# Patient Record
Sex: Female | Born: 1970 | Race: Black or African American | Hispanic: No | Marital: Married | Smoking: Current some day smoker
Health system: Southern US, Community
[De-identification: ages and names within clinical notes are randomized; demographics above are authoritative.]

## PROBLEM LIST (undated history)

## (undated) DIAGNOSIS — D259 Leiomyoma of uterus, unspecified: Secondary | ICD-10-CM

## (undated) DIAGNOSIS — Z720 Tobacco use: Secondary | ICD-10-CM

## (undated) DIAGNOSIS — T7840XA Allergy, unspecified, initial encounter: Secondary | ICD-10-CM

## (undated) HISTORY — DX: Tobacco use: Z72.0

## (undated) HISTORY — DX: Leiomyoma of uterus, unspecified: D25.9

## (undated) HISTORY — DX: Allergy, unspecified, initial encounter: T78.40XA

---

## 1997-10-20 ENCOUNTER — Other Ambulatory Visit: Admission: RE | Admit: 1997-10-20 | Discharge: 1997-10-20 | Payer: Self-pay | Admitting: Obstetrics

## 1998-10-27 ENCOUNTER — Other Ambulatory Visit: Admission: RE | Admit: 1998-10-27 | Discharge: 1998-10-27 | Payer: Self-pay | Admitting: Obstetrics

## 1998-10-31 ENCOUNTER — Encounter: Payer: Self-pay | Admitting: Obstetrics

## 1998-10-31 ENCOUNTER — Ambulatory Visit (HOSPITAL_COMMUNITY): Admission: RE | Admit: 1998-10-31 | Discharge: 1998-10-31 | Payer: Self-pay | Admitting: Obstetrics

## 1999-09-19 ENCOUNTER — Emergency Department (HOSPITAL_COMMUNITY): Admission: EM | Admit: 1999-09-19 | Discharge: 1999-09-19 | Payer: Self-pay | Admitting: Emergency Medicine

## 2000-09-03 ENCOUNTER — Emergency Department (HOSPITAL_COMMUNITY): Admission: EM | Admit: 2000-09-03 | Discharge: 2000-09-03 | Payer: Self-pay | Admitting: Emergency Medicine

## 2002-01-23 ENCOUNTER — Emergency Department (HOSPITAL_COMMUNITY): Admission: EM | Admit: 2002-01-23 | Discharge: 2002-01-23 | Payer: Self-pay | Admitting: Emergency Medicine

## 2002-01-26 ENCOUNTER — Emergency Department (HOSPITAL_COMMUNITY): Admission: EM | Admit: 2002-01-26 | Discharge: 2002-01-26 | Payer: Self-pay | Admitting: Emergency Medicine

## 2002-01-28 ENCOUNTER — Inpatient Hospital Stay (HOSPITAL_COMMUNITY): Admission: AD | Admit: 2002-01-28 | Discharge: 2002-01-28 | Payer: Self-pay | Admitting: Obstetrics and Gynecology

## 2002-12-24 ENCOUNTER — Encounter: Admission: RE | Admit: 2002-12-24 | Discharge: 2002-12-24 | Payer: Self-pay | Admitting: Obstetrics and Gynecology

## 2003-01-14 ENCOUNTER — Encounter: Admission: RE | Admit: 2003-01-14 | Discharge: 2003-01-14 | Payer: Self-pay | Admitting: Obstetrics and Gynecology

## 2003-08-12 ENCOUNTER — Inpatient Hospital Stay (HOSPITAL_COMMUNITY): Admission: AD | Admit: 2003-08-12 | Discharge: 2003-08-13 | Payer: Self-pay | Admitting: Family Medicine

## 2003-09-02 ENCOUNTER — Encounter: Admission: RE | Admit: 2003-09-02 | Discharge: 2003-09-02 | Payer: Self-pay | Admitting: Family Medicine

## 2004-03-02 ENCOUNTER — Ambulatory Visit: Payer: Self-pay | Admitting: Family Medicine

## 2004-03-08 ENCOUNTER — Ambulatory Visit (HOSPITAL_COMMUNITY): Admission: RE | Admit: 2004-03-08 | Discharge: 2004-03-08 | Payer: Self-pay | Admitting: Family Medicine

## 2004-03-23 ENCOUNTER — Ambulatory Visit: Payer: Self-pay | Admitting: Family Medicine

## 2006-07-21 ENCOUNTER — Emergency Department (HOSPITAL_COMMUNITY): Admission: EM | Admit: 2006-07-21 | Discharge: 2006-07-21 | Payer: Self-pay | Admitting: *Deleted

## 2009-10-05 ENCOUNTER — Emergency Department (HOSPITAL_COMMUNITY): Admission: EM | Admit: 2009-10-05 | Discharge: 2009-10-05 | Payer: Self-pay | Admitting: Family Medicine

## 2009-12-05 ENCOUNTER — Emergency Department (HOSPITAL_COMMUNITY): Admission: EM | Admit: 2009-12-05 | Discharge: 2009-12-05 | Payer: Self-pay | Admitting: Emergency Medicine

## 2010-08-26 ENCOUNTER — Inpatient Hospital Stay (INDEPENDENT_AMBULATORY_CARE_PROVIDER_SITE_OTHER)
Admission: RE | Admit: 2010-08-26 | Discharge: 2010-08-26 | Disposition: A | Discharge status: Home / Self Care | Payer: Self-pay | Source: Ambulatory Visit | Attending: Family Medicine | Admitting: Family Medicine

## 2010-08-26 ENCOUNTER — Ambulatory Visit (INDEPENDENT_AMBULATORY_CARE_PROVIDER_SITE_OTHER): Payer: Self-pay

## 2010-08-26 DIAGNOSIS — M129 Arthropathy, unspecified: Secondary | ICD-10-CM

## 2010-08-26 DIAGNOSIS — M79609 Pain in unspecified limb: Secondary | ICD-10-CM

## 2010-08-26 DIAGNOSIS — M25469 Effusion, unspecified knee: Secondary | ICD-10-CM

## 2010-09-08 NOTE — Group Therapy Note (Signed)
[name], [name] NO.:  [id number]   MEDICAL RECORD NO.:  [id number]                   PATIENT TYPE:  OUT   LOCATION:  [facility]                           FACILITY:  [facility]   PHYSICIAN:  Rosemarie Ax, MD                DATE OF BIRTH:  05/12/1970   DATE OF SERVICE:  12/24/2002                                    CLINIC NOTE   CHIEF COMPLAINT:  Complete physical examination, vaginal discharge,  dyspareunia.   OBJECTIVE:  The patient is a 40 year old African-American female who  presents as a new patient to the clinic today for her annual Pap smear.  She  denies any history of abnormal Paps in the past.  Last Pap smear 2003.  LMP  December 14, 2002.   She reports several months of vaginal discharge about a week after each  menses with increased odor.  No itching or burning in her vaginal area.  No  vaginal lesions.   Also reports pain during intercourse only with deep penetration.  She feels  like her husband is hitting something when they are having sex.  She feels  like she has always felt this way during sex.  She does also report some  vaginal dryness, but states this is not a problem since they use  lubrication.  She has been told in the past that her uterus is retroverted.   She also notes that she has difficulty conceiving.  She has never been  pregnant and has been having intercourse for many years without  contraception and has never gotten pregnant.  She does think she has had one  or two episodes of pelvic inflammatory disease in the past secondary to  Chlamydia.   PAST MEDICAL HISTORY:  History of jaundice as an infant.   SOCIAL HISTORY:  She is married and states she thinks she is a monogamous  relationship.  She smokes four cigarettes a day x15 years.  Drinks about two  alcoholic beverages a week.  She works.   REVIEW OF SYSTEMS:  As above, otherwise negative.   FAMILY HISTORY:  Unremarkable.   PAST  OBSTETRICAL/GYNECOLOGICAL HISTORY:  No pregnancies.  Menstrual cycles  are regular, 21-24 days apart.  They last five to seven days with medium  flow and mild cramps.  40 years old at menarche.  She has been on the birth  control pill in the past, but has not taken this for about three months.   PHYSICAL EXAMINATION:  VITAL SIGNS:  Stated.  GENERAL:  Well-developed, well-nourished.  HEENT:  Normocephalic, atraumatic.  NECK:  No thyromegaly.  CARDIOVASCULAR:  Regular rate and rhythm.  No murmurs.  LUNGS:  Clear to auscultation bilaterally.  Good air movement.  ABDOMEN:  Soft, nontender, nondistended.  No hepatosplenomegaly.  BREASTS:  No skin changes.  No nipple discharge.  No masses or  lymphadenopathy bilaterally.  EXTREMITIES:  No clubbing, cyanosis, edema.  PELVIC:  Normal external female genitalia.  Slight  swelling of the left  labia majora consistent with prior Bartholin's cyst several months ago but  this appears well healed with no residual significant cyst.  Normal  appearing vaginal mucosa and cervix.  Frothy white discharge in the vault.  Pap smear obtained.  Bimanual examination shows cervix displaced to the  right, uterus retroverted.  No adnexal tenderness or masses.  No cervical  motion tenderness.   LABORATORIES:  Wet prep positive for Trichomonas and clue cells.  GC,  Chlamydia pending.   ASSESSMENT/PLAN:  1. Pap smear obtained.  2. Trichomonas, bacterial vaginosis.  The patient was counseled about this.     Will treat with Flagyl 250 mg p.o. t.i.d. x7 days.  Advised partner to be     treated as well.  Advised no alcohol while using this.  3. Dyspareunia.  Follow up in two to three weeks after she tries positional     changes such as missionary position with her legs flat.  At that time if     still having pain, will do ultrasound.  4. Difficulty conceiving.  May be secondary to previous episodes of pelvic     inflammatory disease.  We agree that if she ever decides  that she is very     serious about getting pregnant that she will make an appointment to come     back and discuss this.                                               Rosemarie Ax, MD    NR/MEDQ  D:  12/24/2002  T:  12/25/2002  Job:  [id number]

## 2010-09-08 NOTE — Group Therapy Note (Signed)
[name], [name] NO.:  [id number]   MEDICAL RECORD NO.:  [id number]          PATIENT TYPE:  WOC   LOCATION:  [facility]                   FACILITY:  [facility]   PHYSICIAN:  Tinnie Gens, MD        DATE OF BIRTH:  [birth year]   DATE OF SERVICE:  03/23/2004                                    CLINIC NOTE   CHIEF COMPLAINT:  Follow-up.   HISTORY OF PRESENT ILLNESS:  The patient is a 40 year old G0 who has  previously been seen in this clinic for fibroids.  She had a recent  ultrasound that showed a 2 mm endometrial stripe with multiple fibroids, the  largest of which was 5.4 cm on the uterus fundus.  Most of the fibroids  appeared to be subserosal.  The patient continues to have some issues with  bleeding.  She is off the birth control now that she is 47 and continues to  be a smoker.  The patient has had a Pap smear in the last 3 weeks and it was  normal.  The patient is still considering her fertility options.  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   PHYSICAL EXAMINATION TODAY:  VITAL SIGNS:  Are as noted in the chart, blood  pressure is 130/90.  GENERAL:  She is a well-developed, well-nourished black female in no acute  distress.  ABDOMEN:  Soft, nontender, nondistended.   IMPRESSION:  1.  Fibroid uterus.  2.  Previous infertility.   PLAN:  Because of the patient's fertility issues have discussed timed  intercourse and give her couple of months of this.  If she fails to have  spontaneous conception will consider workup repeat plus or minus referral to  endocrinology.      TP/MEDQ  D:  03/23/2004  T:  03/23/2004  Job:  [id number]

## 2010-09-08 NOTE — Group Therapy Note (Signed)
NAME:  [name], INGBER NO.:  [id number]   MEDICAL RECORD NO.:  [id number]                   PATIENT TYPE:  OUT   LOCATION[facility]                           [facility]ACILITY[facility]   PHYSICIAN:  Argentina Donovan, MD                     DATE OF BIRTH:  August 14, 1970   DATE OF SERVICE:  09/02/2003                                    CLINIC NOTE   REASON FOR VISIT:  The patient is a 40 year old nulligravida who was seen on  August 12, 2003 in the [facility] because of dysfunctional uterine bleeding.  Examination was normal and the patient was placed on Yasmine for cycling.  Her hemoglobin at the time was 13.1, hematocrit of 39.5, and physical  examination was normal.  She had a normal Pap smear done on September 2004  and comes in today telling us the bleeding has stopped, she has no symptoms,  and we discussed using the pill and we will continue her on it and a  prescription has been given for [facility] as well as two sample packages.   IMPRESSION:  Recurrent dysfunctional bleeding.  Too early for Pap smear.  The patient will be seen in September for that, and continued on Yasmine.                                               Argentina Donovan, MD    PR/MEDQ  D:  09/02/2003  T:  09/02/2003  Job:  [id number]

## 2010-09-08 NOTE — Group Therapy Note (Signed)
   NAMELAMERLE, JABS NO.:  [id number]   MEDICAL RECORD NO.:  [id number]                   PATIENT TYPE:  OUT   LOCATION[facility]                           [facility]   PHYSICIAN:  Ellis Parents, MD                 DATE OF BIRTH:  1971-03-07   DATE OF SERVICE:  01/14/2003                                    CLINIC NOTE   REASON FOR VISIT:  This patient returns for a follow up of a Pap smear  report and the vaginitis that she experienced and was treated for on  December 24, 2002.   ADDITIONAL HISTORY:  The patient's sexual history is significant in that  sexual intercourse is uncomfortable of course with the deep thrust  dyspareunia but the patient does not get adequately aroused even though the  marriage is good and she states that her husband is a nice guy.  There is  no adequate foreplay and the vagina is dry with introduction of the penis.  This has been present for approximately 12 years and the patient is well  aware of this sexual dysfunction.  She had a hysterosalpingogram at [facility] approximately 4 years ago which the patient states showed left  tubal occlusion.  The patient is interested in pregnancy but not interested  in a diagnostic laparoscopy at this time as a precursor to probable in vitro  fertilization.   PHYSICAL EXAMINATION:  PELVIC:  The vagina is clean.  Wet prep is negative  for Trichomonas.  The cervix is clean.  The uterus is anterior, normal in  size, nontender and mobile, and both adnexa are soft and nontender.   The sexual pattern was discussed with the patient and she was made aware  that the deep thrust dyspareunia was related to her slight degree of sexual  dysfunction.                                               Ellis Parents, MD    SA/MEDQ  D:  01/14/2003  T:  01/14/2003  Job:  [id number]

## 2010-09-08 NOTE — Group Therapy Note (Signed)
NAMEHENNESSEY, CANTRELL NO.:  [id number]   MEDICAL RECORD NO.:  [id number]          PATIENT TYPE:  WOC   [facility]ATION[facility]                   [facility]   PHYSICIAN:  Tinnie Gens, MD        DATE OF BIRTH:  1971-01-06   DATE OF SERVICE:                                    CLINIC NOTE   CHIEF COMPLAINT:  Abnormal bleeding.   HISTORY OF PRESENT ILLNESS:  The patient is a 41 year old nulligravida, who  was previously seen here for abnormal vaginal bleeding.  She has been on  Yasmin for a good bit of time, but over the last month, has noted that she  is bleeding post coitally.   The patient has no significant change in her past medical, surgical, social,  or family history.   On exam, she is a well-developed, well-nourished black female in no acute  distress.  Her vital signs are as noted in the chart.  GU:  She has normal external female genitalia, except for a left Bartholin  cyst.  The cervix was visualized without lesion.  A Pap smear was obtained.  Attempt was made to pass an endometrial Pipelle for sampling, however, this  could not be passed.  The uterus was small, anteverted, nontender.  Adnexa  without mass or tenderness.   IMPRESSION:  1.  Abnormal uterine bleeding.  2.  Annual exam.   PLAN:  1.  Ultrasound for abnormal bleeding to rule out pathology.  2.  The patient will probably need to be scheduled for a D&C for endometrial      sampling at our next visit.  3.  Follow up three to four weeks for this.  4.  Pap smear today.  5.  A lengthy discussion was had with this patient regarding birth control      pills and age 75 with smoking.  A decision was made to not give this      patient any further birth control at this time.      TP/MEDQ  D:  03/02/2004  T:  03/03/2004  Job:  [id number]

## 2011-06-30 ENCOUNTER — Emergency Department (HOSPITAL_COMMUNITY)
Admission: EM | Admit: 2011-06-30 | Discharge: 2011-06-30 | Disposition: A | Discharge status: Home / Self Care | Payer: No Typology Code available for payment source | Attending: Emergency Medicine | Admitting: Emergency Medicine

## 2011-06-30 ENCOUNTER — Emergency Department (HOSPITAL_COMMUNITY): Payer: Self-pay

## 2011-06-30 ENCOUNTER — Encounter (HOSPITAL_COMMUNITY): Payer: Self-pay | Admitting: *Deleted

## 2011-06-30 DIAGNOSIS — R947 Abnormal results of other endocrine function studies: Secondary | ICD-10-CM | POA: Insufficient documentation

## 2011-06-30 DIAGNOSIS — Y92410 Unspecified street and highway as the place of occurrence of the external cause: Secondary | ICD-10-CM | POA: Insufficient documentation

## 2011-06-30 DIAGNOSIS — N9489 Other specified conditions associated with female genital organs and menstrual cycle: Secondary | ICD-10-CM | POA: Insufficient documentation

## 2011-06-30 DIAGNOSIS — M5030 Other cervical disc degeneration, unspecified cervical region: Secondary | ICD-10-CM | POA: Insufficient documentation

## 2011-06-30 DIAGNOSIS — M542 Cervicalgia: Secondary | ICD-10-CM | POA: Insufficient documentation

## 2011-06-30 DIAGNOSIS — M545 Low back pain, unspecified: Secondary | ICD-10-CM | POA: Insufficient documentation

## 2011-06-30 DIAGNOSIS — M25529 Pain in unspecified elbow: Secondary | ICD-10-CM | POA: Insufficient documentation

## 2011-06-30 DIAGNOSIS — R109 Unspecified abdominal pain: Secondary | ICD-10-CM | POA: Insufficient documentation

## 2011-06-30 DIAGNOSIS — T148XXA Other injury of unspecified body region, initial encounter: Secondary | ICD-10-CM

## 2011-06-30 MED ORDER — IBUPROFEN 800 MG PO TABS
800.0000 mg | ORAL_TABLET | Freq: Once | ORAL | Status: AC
  Administered 2011-06-30: 800 mg via ORAL
  Filled 2011-06-30: qty 1

## 2011-06-30 MED ORDER — HYDROCODONE-ACETAMINOPHEN 5-500 MG PO TABS: 1.0000 | ORAL_TABLET | Freq: Four times a day (QID) | ORAL | Status: AC | PRN

## 2011-06-30 MED ORDER — CYCLOBENZAPRINE HCL 10 MG PO TABS: 10.0000 mg | ORAL_TABLET | Freq: Two times a day (BID) | ORAL | Status: AC | PRN
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
## 2011-06-30 NOTE — Discharge Instructions (Signed)
Contusion  A contusion is a deep bruise. Contusions happen when an injury causes bleeding under the skin. Signs of bruising include pain, puffiness (swelling), and discolored skin. The contusion may turn blue, purple, or yellow.  HOME CARE    Put ice on the injured area.   Put ice in a plastic bag.   Place a towel between your skin and the bag.   Leave the ice on for 15 to 20 minutes, 3 to 4 times a day.   Only take medicine as told by your doctor.   Rest the injured area.   If possible, raise (elevate) the injured area to lessen puffiness.  GET HELP RIGHT AWAY IF:    You have more bruising or puffiness.   You have pain that is getting worse.   Your puffiness or pain is not helped by medicine.  MAKE SURE YOU:    Understand these instructions.   Will watch your condition.   Will get help right away if you are not doing well or get worse.  Document Released: 09/26/2007 Document Revised: 03/29/2011 Document Reviewed: 02/12/2011  Exit[employer] Patient Information 2012 [employer].

## 2015-05-26 ENCOUNTER — Ambulatory Visit (INDEPENDENT_AMBULATORY_CARE_PROVIDER_SITE_OTHER): Payer: Self-pay | Admitting: Obstetrics and Gynecology

## 2015-05-26 ENCOUNTER — Other Ambulatory Visit (HOSPITAL_COMMUNITY)
Admission: RE | Admit: 2015-05-26 | Discharge: 2015-05-26 | Disposition: A | Discharge status: Home / Self Care | Payer: Self-pay | Source: Ambulatory Visit | Attending: Obstetrics and Gynecology | Admitting: Obstetrics and Gynecology

## 2015-05-26 ENCOUNTER — Other Ambulatory Visit: Payer: Self-pay | Admitting: Obstetrics and Gynecology

## 2015-05-26 ENCOUNTER — Encounter: Payer: Self-pay | Admitting: Obstetrics and Gynecology

## 2015-05-26 VITALS — BP 150/82 | HR 72 | Temp 98.0°F | Wt 149.8 lb

## 2015-05-26 DIAGNOSIS — Z1151 Encounter for screening for human papillomavirus (HPV): Secondary | ICD-10-CM

## 2015-05-26 DIAGNOSIS — Z124 Encounter for screening for malignant neoplasm of cervix: Secondary | ICD-10-CM

## 2015-05-26 DIAGNOSIS — Z113 Encounter for screening for infections with a predominantly sexual mode of transmission: Secondary | ICD-10-CM

## 2015-05-26 DIAGNOSIS — N938 Other specified abnormal uterine and vaginal bleeding: Secondary | ICD-10-CM | POA: Insufficient documentation

## 2015-05-26 DIAGNOSIS — N898 Other specified noninflammatory disorders of vagina: Secondary | ICD-10-CM

## 2015-05-26 DIAGNOSIS — N939 Abnormal uterine and vaginal bleeding, unspecified: Secondary | ICD-10-CM

## 2015-05-26 DIAGNOSIS — D509 Iron deficiency anemia, unspecified: Secondary | ICD-10-CM | POA: Insufficient documentation

## 2015-05-26 LAB — CBC
HEMATOCRIT: 29.7 % — AB (ref 36.0–46.0)
HEMOGLOBIN: 8.2 g/dL — AB (ref 12.0–15.0)
MCH: 18.6 pg — ABNORMAL LOW (ref 26.0–34.0)
MCHC: 27.6 g/dL — ABNORMAL LOW (ref 30.0–36.0)
MCV: 67.3 fL — ABNORMAL LOW (ref 78.0–100.0)
MPV: 8.4 fL — AB (ref 8.6–12.4)
Platelets: 342 10*3/uL (ref 150–400)
RBC: 4.41 MIL/uL (ref 3.87–5.11)
RDW: 18.6 % — ABNORMAL HIGH (ref 11.5–15.5)
WBC: 7 10*3/uL (ref 4.0–10.5)

## 2015-05-26 LAB — POCT PREGNANCY, URINE: Preg Test, Ur: NEGATIVE

## 2015-05-26 NOTE — Progress Notes (Addendum)
CLINIC ENCOUNTER NOTE  History:  45 y.o. G0 here for new gyn visit.  Complains of dysmenorrhea. Menarche age 69. Initial light periods. For past 10 years has had painful menstrual cramps and periods that last up to 2 weeks, up to 6-8 pads a day maximum. Sometimes spotting. 2005 ultrasound showed multiple fibroids. No current birth control. Sometimes clots. Unsure last pap, no history abnormal. No family breast or ovarian cancer.  Past Medical History  Diagnosis Date  . Tobacco abuse     No past surgical history on file.  The following portions of the patient's history were reviewed and updated as appropriate: allergies, current medications, past family history, past medical history, past social history, past surgical history and problem list.    Review of Systems:  See above; comprehensive review of systems was otherwise negative.  Objective:  Physical Exam BP 150/82 mmHg  Pulse 72  Temp(Src) 98 F (36.7 C)  Wt 149 lb 12.8 oz (67.949 kg)  LMP 05/23/2015 (Approximate) CONSTITUTIONAL: Well-developed, well-nourished female in no acute distress.  HENT:  Normocephalic, atraumatic SKIN: Skin is warm and dry.  [place]: Alert  PSYCHIATRIC: Normal mood and affect.  CARDIOVASCULAR: Normal heart rate noted RESPIRATORY: Effort and breath sounds normal, no problems with respiration noted ABDOMEN: Soft, no distention noted.  No tenderness, rebound or guarding.  Sse: normal vulva, vagina, and cervix  Procedure: EMB Written informed consent obtained Speculum Betadine Tenaculum to anterior lip Pipelle inserted to 6 cm, emb x2 Tenaculum removed Patient complained of pain during procedure, close to resolved at end of procedure Minimal bleeding No complications   Labs and Imaging No results found.  Assessment & Plan:   # Abnormal uterine bleeding - multiple years dysmenorrhea and worsening menorrhagia. Previous u/s showing uterine fibroids - repeat US as 10 years has passed  since last - EMB and pap obtained today - labs: cbc, tsh, androgens, pt/ptt, prolactin, a1c. Upregn negative today - f/u after - ibuprofen prn - emb procedure return precautions  Routine preventative health maintenance measures emphasized.     Noah B. Wouk, [place] [facility]   Addendum - ferritin low, H 8.2, will start ferrous sulfate bid and have nursing call to inform

## 2015-05-27 LAB — WET PREP, GENITAL
Trich, Wet Prep: NONE SEEN
Yeast Wet Prep HPF POC: NONE SEEN

## 2015-05-27 LAB — PROTIME-INR
INR: 0.94 (ref <1.50)
Prothrombin Time: 12.7 seconds (ref 11.6–15.2)

## 2015-05-27 LAB — TESTOSTERONE, FREE, TOTAL, SHBG
Sex Hormone Binding: 75 nmol/L (ref 17–124)
Testosterone, Free: 4 pg/mL (ref 0.6–6.8)
Testosterone-% Free: 1 % (ref 0.4–2.4)
Testosterone: 39 ng/dL

## 2015-05-27 LAB — TSH: TSH: 1.179 u[IU]/mL (ref 0.350–4.500)

## 2015-05-27 LAB — HEMOGLOBIN A1C
Hgb A1c MFr Bld: 5.5 % (ref <5.7)
MEAN PLASMA GLUCOSE: 111 mg/dL (ref <117)

## 2015-05-27 LAB — FERRITIN: Ferritin: 7 ng/mL — ABNORMAL LOW (ref 10–291)

## 2015-05-27 LAB — APTT: aPTT: 27 seconds (ref 24–37)

## 2015-05-27 LAB — GC/CHLAMYDIA PROBE AMP (~~LOC~~) NOT AT ARMC
Chlamydia: NEGATIVE
Neisseria Gonorrhea: NEGATIVE

## 2015-05-27 LAB — PROLACTIN: PROLACTIN: 9.9 ng/mL

## 2015-05-28 MED ORDER — FERROUS SULFATE 325 (65 FE) MG PO TABS: 325.0000 mg | ORAL_TABLET | Freq: Two times a day (BID) | ORAL | Status: DC

## 2015-05-28 NOTE — Addendum Note (Signed)
Addended by: Laurey Arrow B on: 05/28/2015 10:08 AM   Modules accepted: Orders

## 2015-05-30 LAB — TESTOS,TOTAL,FREE AND SHBG (FEMALE)
SEX HORMONE BINDING GLOB.: 74 nmol/L (ref 17–124)
TESTOSTERONE,TOTAL,LC/MS/MS: 27 ng/dL (ref 2–45)
Testosterone, Free: 2 pg/mL (ref 0.1–6.4)

## 2015-05-30 LAB — CYTOLOGY - PAP

## 2015-06-02 ENCOUNTER — Encounter: Payer: Self-pay | Admitting: Obstetrics & Gynecology

## 2015-06-02 ENCOUNTER — Ambulatory Visit (HOSPITAL_COMMUNITY)
Admission: RE | Admit: 2015-06-02 | Discharge: 2015-06-02 | Disposition: A | Discharge status: Home / Self Care | Payer: Self-pay | Source: Ambulatory Visit | Attending: Obstetrics and Gynecology | Admitting: Obstetrics and Gynecology

## 2015-06-02 DIAGNOSIS — N939 Abnormal uterine and vaginal bleeding, unspecified: Secondary | ICD-10-CM | POA: Insufficient documentation

## 2015-06-02 DIAGNOSIS — N852 Hypertrophy of uterus: Secondary | ICD-10-CM | POA: Insufficient documentation

## 2015-06-02 DIAGNOSIS — D509 Iron deficiency anemia, unspecified: Secondary | ICD-10-CM | POA: Insufficient documentation

## 2015-06-02 DIAGNOSIS — N858 Other specified noninflammatory disorders of uterus: Secondary | ICD-10-CM | POA: Insufficient documentation

## 2015-06-02 DIAGNOSIS — D259 Leiomyoma of uterus, unspecified: Secondary | ICD-10-CM | POA: Insufficient documentation

## 2015-06-08 ENCOUNTER — Telehealth: Payer: Self-pay | Admitting: General Practice

## 2015-06-08 NOTE — Telephone Encounter (Signed)
Per Dr Si Raider, patient's EMB, Pap, and labs are all wnl. Her u/s shows many fibroids which are the likely cause of her pain and bleeding. Patient can see surgeon if she would like to discuss surgical options otherwise she can have a f/u appt with Dr Si Raider. Called patient & informed her of results & recommendations. Patient verbalized understanding & states she would like to meet with a surgeon. Told patient I would let our front office know & they will call her to set that up. Patient verbalized understanding & had no questions

## 2015-06-30 ENCOUNTER — Encounter: Payer: Self-pay | Admitting: Obstetrics & Gynecology

## 2015-06-30 ENCOUNTER — Ambulatory Visit (INDEPENDENT_AMBULATORY_CARE_PROVIDER_SITE_OTHER): Payer: Self-pay | Admitting: Obstetrics & Gynecology

## 2015-06-30 VITALS — BP 147/93 | HR 79 | Temp 98.5°F | Ht 67.5 in | Wt 142.7 lb

## 2015-06-30 DIAGNOSIS — N939 Abnormal uterine and vaginal bleeding, unspecified: Secondary | ICD-10-CM

## 2015-06-30 DIAGNOSIS — D259 Leiomyoma of uterus, unspecified: Secondary | ICD-10-CM

## 2015-06-30 MED ORDER — MEGESTROL ACETATE 20 MG PO TABS: 40.0000 mg | ORAL_TABLET | Freq: Every day | ORAL | Status: DC

## 2015-06-30 NOTE — Patient Instructions (Signed)
Uterine Fibroids Uterine fibroids are tissue masses (tumors) that can develop in the womb (uterus). They are also called leiomyomas. This type of tumor is not cancerous (benign) and does not spread to other parts of the body outside of the pelvic area, which is between the hip bones. Occasionally, fibroids may develop in the fallopian tubes, in the cervix, or on the support structures (ligaments) that surround the uterus. You can have one or many fibroids. Fibroids can vary in size, weight, and where they grow in the uterus. Some can become quite large. Most fibroids do not require medical treatment. CAUSES A fibroid can develop when a single uterine cell keeps growing (replicating). Most cells in the human body have a control mechanism that keeps them from replicating without control. SIGNS AND SYMPTOMS Symptoms may include:   Heavy bleeding during your period.  Bleeding or spotting between periods.  Pelvic pain and pressure.  Bladder problems, such as needing to urinate more often (urinary frequency) or urgently.  Inability to reproduce offspring (infertility).  Miscarriages. DIAGNOSIS Uterine fibroids are diagnosed through a physical exam. Your health care provider may feel the lumpy tumors during a pelvic exam. Ultrasonography and an MRI may be done to determine the size, location, and number of fibroids. TREATMENT Treatment may include:  Watchful waiting. This involves getting the fibroid checked by your health care provider to see if it grows or shrinks. Follow your health care provider's recommendations for how often to have this checked.  Hormone medicines. These can be taken by mouth or given through an intrauterine device (IUD).  Surgery.  Removing the fibroids (myomectomy) or the uterus (hysterectomy).  Removing blood supply to the fibroids (uterine artery embolization). If fibroids interfere with your fertility and you want to become pregnant, your health care provider  may recommend having the fibroids removed.  HOME CARE INSTRUCTIONS  Keep all follow-up visits as directed by your health care provider. This is important.  Take medicines only as directed by your health care provider.  If you were prescribed a hormone treatment, take the hormone medicines exactly as directed.  Do not take aspirin, because it can cause bleeding.  Ask your health care provider about taking iron pills and increasing the amount of dark green, leafy vegetables in your diet. These actions can help to boost your blood iron levels, which may be affected by heavy menstrual bleeding.  Pay close attention to your period and tell your health care provider about any changes, such as:  Increased blood flow that requires you to use more pads or tampons than usual per month.  A change in the number of days that your period lasts per month.  A change in symptoms that are associated with your period, such as abdominal cramping or back pain. SEEK MEDICAL CARE IF:  You have pelvic pain, back pain, or abdominal cramps that cannot be controlled with medicines.  You have an increase in bleeding between and during periods.  You soak tampons or pads in a half hour or less.  You feel lightheaded, extra tired, or weak. SEEK IMMEDIATE MEDICAL CARE IF:  You faint.  You have a sudden increase in pelvic pain.   This information is not intended to replace advice given to you by your health care provider. Make sure you discuss any questions you have with your health care provider.   Document Released: 04/06/2000 Document Revised: 04/30/2014 Document Reviewed: 10/06/2013 Elsevier Interactive Patient Education 2016 [employer]  

## 2015-06-30 NOTE — Progress Notes (Signed)
Patient ID: [name], female   DOB: [birth year], 45 y.o.   MRN: [id number] History:  45 y.o. G0 No obstetric history on file. here today for f/u of AUB and uterine fibroids. She is s/p endo bx and sono.  She reports continued passage of clots and heavy menses.  She wants definitive treatment.   The following portions of the patient's history were reviewed and updated as appropriate: allergies, current medications, past family history, past medical history, past social history, past surgical history and problem list.  Review of Systems:  Pertinent items are noted in HPI.  Objective:  Physical Exam Blood pressure 147/93, pulse 79, temperature 98.5 F (36.9 C), temperature source Oral, height 5' 7.5" (1.715 m), weight 142 lb 11.2 oz (64.728 kg), last menstrual period 06/21/2015. Gen: NAD Abd:   Mass palpable 4 cm under umbilicus; soft, NT, ND  Pelvic exam: derferred  05/26/2015 Diagnosis Endometrium, biopsy - BENIGN ENDOMETRIAL TISSUE FRAGMENTS. - BENIGN ENDOCERVICAL MUCOSAL FRAGMENTS. - BENIGN SQUAMOUS MUCOSAL FRAGMENTS. - NO ATYPIA OR MALIGNANCY IDENTIFIED. - SEE COMMENT Labs and Imaging US Pelvis Complete  06/02/2015  CLINICAL DATA:  Abnormal uterine bleeding. Anemia and dysmenorrhea. History of fibroids. LMP 05/23/2015. EXAM: TRANSABDOMINAL ULTRASOUND OF PELVIS TECHNIQUE: Transabdominal ultrasound examination of the pelvis was performed including evaluation of the uterus, ovaries, adnexal regions, and pelvic cul-de-sac. COMPARISON:  03/08/2004 FINDINGS: Uterus Measurements: At least 12.7 x 7.7 x 10.6 cm. Multiple uterine fibroids are present. Left fundal fibroid is 7.9 x 7.6 x 6.6 cm. Right fundal fibroid is 4.3 x 4.6 x 4.6 cm. Right mid uterine fibroid is 3.1 x 3.0 x 3.1 cm. Left mid uterine fibroid is 2.4 x 2.3 x 2.0 cm. Posterior fibroid is 3.1 x 2.6 x 3.0 cm. Fundal fibroid is 4.3 x 4.0 x 3.9 cm. Anterior fibroid is 2.3 x 1.6 x 1.8 cm. Endometrium Thickness: Difficult to measure  because of the presence of endometrial masses. Two endometrial masses are identified, measuring 3.2 x 2.4 x 2.7 cm and 2.1 x 1.9 x 1.6 cm. Right ovary Measurements: The ovary is not visualized, either absent or obscured . No adnexal mass identified. Left ovary Measurements: The ovary is not visualized, either absent or obscured . No adnexal mass identified. Other findings: No abnormal free fluid. Patient declined the endovaginal portion of exam. IMPRESSION: 1. Enlarged uterus containing numerous fibroids. 2. 2 discrete endometrial masses likely representing submucosal fibroids. Although polyps could have a similar appearance, the appearance favors fibroids. Consider further evaluation with sonohysterogram for confirmation prior to hysteroscopy. Endometrial sampling should also be considered if patient is at high risk for endometrial carcinoma. (Ref: Radiological Reasoning: Algorithmic Workup of Abnormal Vaginal Bleeding with Endovaginal Sonography and Sonohysterography. AJR 2008; [id number]) 3. Nonvisualized ovaries. Electronically Signed   By: Nolon Nations M.D.   On: 06/02/2015 11:51    Assessment & Plan:  Fibroid uterus- symptomatic.  Reviewed tx options including hyst- LAVH vs TAH vs RATH.  Pt want a hyst but needs to delay until she complete her financial aid paperwork  Reviewed results of Endo bx Pt to com[lete financial aid paperwork Megace 40 mg 1 po q day F/u for preop once info back on paperwork or in 3 months  Casara Perrier L. Harraway-Smith, M.D., Cherlynn June

## 2016-02-23 ENCOUNTER — Encounter (HOSPITAL_COMMUNITY): Payer: Self-pay | Admitting: Emergency Medicine

## 2016-02-23 ENCOUNTER — Ambulatory Visit (HOSPITAL_COMMUNITY)
Admission: EM | Admit: 2016-02-23 | Discharge: 2016-02-23 | Disposition: A | Discharge status: Home / Self Care | Payer: Self-pay | Attending: Emergency Medicine | Admitting: Emergency Medicine

## 2016-02-23 DIAGNOSIS — R200 Anesthesia of skin: Secondary | ICD-10-CM

## 2016-02-23 DIAGNOSIS — M7712 Lateral epicondylitis, left elbow: Secondary | ICD-10-CM

## 2016-02-23 DIAGNOSIS — G5602 Carpal tunnel syndrome, left upper limb: Secondary | ICD-10-CM

## 2016-02-23 DIAGNOSIS — R202 Paresthesia of skin: Secondary | ICD-10-CM

## 2016-02-23 MED ORDER — NAPROXEN 375 MG PO TABS: 375.0000 mg | ORAL_TABLET | Freq: Two times a day (BID) | ORAL | 0 refills | Status: DC

## 2016-02-23 NOTE — Discharge Instructions (Signed)
Wear the left splint at all times during work for the next 7-10 days. You may remove it periodically and move your wrist around. Applies ice to the wrist and the elbow. Take the Naprosyn for pain and inflammation. Call your primary care provider as you may need to have a test called nerve conduction study to verify and diagnose the reason for your numbness.

## 2016-02-23 NOTE — ED Provider Notes (Signed)
CSN: [id number]     Arrival date & time 02/23/16  1501 History   First MD Initiated Contact with Patient 02/23/16 1709     Chief Complaint  Patient presents with  . Numbness   (Consider location/radiation/quality/duration/timing/severity/associated sxs/prior Treatment) 45 year old female complaining of numbness to the left arm and left leg for 2 days. Denies any known injury. She states that she does not have any feeling in her left index and long fingers. Denies pain. After questioning several times she was unable to describe what type of work she does. She did say that she has prolonged standing and no repetitive work and unable to describe exactly what type of physical activity is involved. The numbness is primarily to the fingers as above. And the entire left leg.      Past Medical History:  Diagnosis Date  . Tobacco abuse    History reviewed. No pertinent surgical history. History reviewed. No pertinent family history. Social History  Substance Use Topics  . Smoking status: Current Every Day Smoker    Types: Cigarettes  . Smokeless tobacco: Never Used  . Alcohol use Yes   OB History    No data available     Review of Systems  Constitutional: Negative.   HENT: Negative.   Respiratory: Negative.   Cardiovascular: Negative.   Musculoskeletal: Negative.   Neurological: Positive for numbness.  Psychiatric/Behavioral: Negative.   All other systems reviewed and are negative.   Allergies  Review of patient's allergies indicates no known allergies.  Home Medications   Prior to Admission medications   Medication Sig Start Date End Date Taking? Authorizing Provider  ferrous sulfate (FERROUSUL) 325 (65 FE) MG tablet Take 1 tablet (325 mg total) by mouth 2 (two) times daily. Patient not taking: Reported on 02/23/2016 05/28/15   Gwynne Edinger, MD  naproxen (NAPROSYN) 375 MG tablet Take 1 tablet (375 mg total) by mouth 2 (two) times daily. 02/23/16   Janne Napoleon, NP   Meds  Ordered and Administered this Visit  Medications - No data to display  BP 137/72 (BP Location: Right Arm)   Pulse 80   Temp 98.4 F (36.9 C) (Oral)   Resp 18   SpO2 100%  No data found.   Physical Exam  Constitutional: She is oriented to person, place, and time. She appears well-developed and well-nourished. No distress.  HENT:  Head: Atraumatic.  Eyes: EOM are normal.  Neck: Normal range of motion. Neck supple.  Cardiovascular: Normal rate.   Pulmonary/Chest: Effort normal.  Musculoskeletal: Normal range of motion. She exhibits no edema or deformity.  Able to mobilize all digits of the left upper extremity, the wrist, elbow and shoulder. Strength is 5 over 5. Movement produces pain in the elbow and along the forearm musculature and the wrist. Negative Tinel's, positive Phalen's. Reproducible tenderness over the left lateral epicondyles. Decrease sensation to the left index and long fingers even to increase pressure. Normal capillary refill. Normal warmth of the hand. They will pulse 2+.  Left for extremity with normal strength and movement. Distal neurovascular motor Sentry is intact. Posterior tibialis pulse is intact. No swelling. Normal gait, smooth and balanced.  Lymphadenopathy:    She has no cervical adenopathy.  Neurological: She is alert and oriented to person, place, and time.  Skin: Skin is warm and dry. Capillary refill takes less than 2 seconds.  Psychiatric: She has a normal mood and affect.  Nursing note and vitals reviewed.   Urgent Care Course  Clinical Course    Procedures (including critical care time)  Labs Review Labs Reviewed - No data to display  Imaging Review No results found.   Visual Acuity Review  Right Eye Distance:   Left Eye Distance:   Bilateral Distance:    Right Eye Near:   Left Eye Near:    Bilateral Near:         MDM   1. Paresthesia   2. Carpal tunnel syndrome of left wrist   3. Lateral epicondylitis of left elbow    4. Numbness in left leg    Wear the left splint at all times during work for the next 7-10 days. You may remove it periodically and move your wrist around. Applies ice to the wrist and the elbow. Take the Naprosyn for pain and inflammation. Call your primary care provider as you may need to have a test called nerve conduction study to verify and diagnose the reason for your numbness. Meds ordered this encounter  Medications  . naproxen (NAPROSYN) 375 MG tablet    Sig: Take 1 tablet (375 mg total) by mouth 2 (two) times daily.    Dispense:  20 tablet    Refill:  0    Order Specific Question:   Supervising Provider    Answer:   Melony Overly [id number]   Wrist splint    Janne Napoleon, NP 02/23/16 1745

## 2016-02-23 NOTE — ED Triage Notes (Signed)
C/o intermittent left arm numbness onset 2 days ... Reports she cant feel her 2nd and 3rd digit on left hand  Also reports left shoulder and left leg  Denies inj/trauma.  A&O x4... NAD... Steady gait.

## 2017-07-03 ENCOUNTER — Ambulatory Visit (HOSPITAL_COMMUNITY)
Admission: EM | Admit: 2017-07-03 | Discharge: 2017-07-03 | Disposition: A | Discharge status: Home / Self Care | Payer: Self-pay | Attending: Family Medicine | Admitting: Family Medicine

## 2017-07-03 ENCOUNTER — Encounter (HOSPITAL_COMMUNITY): Payer: Self-pay | Admitting: Emergency Medicine

## 2017-07-03 DIAGNOSIS — M75101 Unspecified rotator cuff tear or rupture of right shoulder, not specified as traumatic: Secondary | ICD-10-CM

## 2017-07-03 MED ORDER — PREDNISONE 20 MG PO TABS: ORAL_TABLET | ORAL | 0 refills | Status: DC

## 2017-07-03 NOTE — ED Triage Notes (Signed)
Pt c/o R shoulder pain x1 month, denies injury. Unknown if injured it, pt states the ROM has been getting worse over the last month.

## 2017-07-03 NOTE — ED Provider Notes (Signed)
  [facility]   [id number] 07/03/17 Arrival Time: 1027   SUBJECTIVE:  [name] is a 47 y.o. female who presents to the urgent care with complaint of R shoulder pain x1 month, denies injury. Unknown if injured it, pt states the ROM has been getting worse over the last month.   Her job involves "pulling orders" and this morning when she reached forward, she felt a sharp pain and almost dropped the order.  Pain reproduced by rotating arm or reaching above shoulder.  Past Medical History:  Diagnosis Date  . Tobacco abuse    No family history on file. Social History   Socioeconomic History  . Marital status: Married    Spouse name: Not on file  . Number of children: Not on file  . Years of education: Not on file  . Highest education level: Not on file  Social Needs  . Financial resource strain: Not on file  . Food insecurity - worry: Not on file  . Food insecurity - inability: Not on file  . Transportation needs - medical: Not on file  . Transportation needs - non-medical: Not on file  Occupational History  . Not on file  Tobacco Use  . Smoking status: Current Every Day Smoker    Types: Cigarettes  . Smokeless tobacco: Never Used  Substance and Sexual Activity  . Alcohol use: Yes  . Drug use: Yes    Types: Cocaine  . Sexual activity: Yes    Birth control/protection: None  Other Topics Concern  . Not on file  Social History Narrative  . Not on file   No outpatient medications have been marked as taking for the 07/03/17 encounter [facility] Encounter).   No Known Allergies    ROS: As per HPI, remainder of ROS negative.   OBJECTIVE:   Vitals:   07/03/17 1130  BP: 118/76  Pulse: 94  Resp: 20  Temp: (!) 97.4 F (36.3 C)  SpO2: 100%     General appearance: alert; no distress Eyes: PERRL; EOMI; conjunctiva normal HENT: normocephalic; atraumatic;  normal; oral mucosa normal Neck: supple tenderness Back: no CVA tenderness Extremities:  no cyanosis or edema; symmetrical with no gross deformities; Pain with abduction, ext rotation of right shoulder.  Pain with palpation of anterior joint line of right shoulder. Skin: warm and dry Neurologic: normal gait; grossly normal Psychological: alert and cooperative; normal mood and affect      Labs:  Results for orders placed or performed in visit on 05/26/15  Ferritin  Result Value Ref Range   Ferritin 7 (L) 10 - 291 ng/mL    Labs Reviewed - No data to display  No results found.     ASSESSMENT & PLAN:  1. Rotator cuff syndrome of right shoulder     Meds ordered this encounter  Medications  . predniSONE (DELTASONE) 20 MG tablet    Sig: Two daily with food    Dispense:  10 tablet    Refill:  0    Reviewed expectations re: course of current medical issues. Questions answered. Outlined signs and symptoms indicating need for more acute intervention. Patient verbalized understanding. After Visit Summary given.    Procedures:      Robyn Haber, MD 07/03/17 1155

## 2017-07-03 NOTE — Discharge Instructions (Signed)
Gentle range of motion exercises of right shoulder for next week to avoid frozen shoulder syndrome.

## 2018-07-10 ENCOUNTER — Encounter (INDEPENDENT_AMBULATORY_CARE_PROVIDER_SITE_OTHER): Payer: Self-pay | Admitting: Ophthalmology

## 2018-07-11 ENCOUNTER — Encounter (INDEPENDENT_AMBULATORY_CARE_PROVIDER_SITE_OTHER): Payer: Self-pay | Admitting: Ophthalmology

## 2018-08-27 NOTE — Progress Notes (Signed)
[facility] Note  08/28/2018     CHIEF COMPLAINT Patient presents for Retina Evaluation   HISTORY OF PRESENT ILLNESS: [name] is a 48 y.o. female who presents to the clinic today for:   HPI    Retina Evaluation    In left eye.  This started 1 month ago.  Duration of 1 month.  Associated Symptoms Negative for Flashes, Blind Spot, Photophobia, Scalp Tenderness, Fever, Floaters, Glare, Pain, Jaw Claudication, Weight Loss, Distortion, Redness, Trauma, Shoulder/Hip pain and Fatigue.  Context:  distance vision, mid-range vision and near vision.  Treatments tried include no treatments.  I, the attending physician,  performed the HPI with the patient and updated documentation appropriately.          Comments    Patient states painless loss of vision OS, starting about 1 month ago. Got new glasses RX from Dr. Marin Comment. Vision was cloudy OS and was told new glasses RX should correct cloudy vision OS, but after getting new glasses RX, vision continued to get worse OS. Vision seems good OD. Patient denies any history of recent floaters or flashes.        Last edited by Bernarda Caffey, MD on 08/28/2018 10:06 AM. (History)    Patient saw Dr Marin Comment about a month ago and noticed her vision was blurry in her left eye.  Patient initially made the appointment with Dr. Marin Comment for blurry vision and was a gradual change that occurred over the last year.  Patient denies any trauma to the left eye or head.  Patient denies any health changes or recent surgeries.  Referring physician: Marin Comment [address] [place], [place], [place] [id number]  HISTORICAL INFORMATION:   Selected notes from the MEDICAL RECORD NUMBER Referred by Dr. Maryjane Hurter for painless loss of vision OS LEE: 03.18.20 (M. Le) [BCVA: OD: 20/20 OS: LP] Ocular Hx- PMH-current smoker    CURRENT MEDICATIONS: No current outpatient medications on file. (Ophthalmic Drugs)   No current facility-administered medications  for this visit.  (Ophthalmic Drugs)   Current Outpatient Medications (Other)  Medication Sig  . predniSONE (DELTASONE) 20 MG tablet Two daily with food (Patient not taking: Reported on 08/28/2018)   No current facility-administered medications for this visit.  (Other)      REVIEW OF SYSTEMS: ROS    Positive for: Eyes   Negative for: Constitutional, Gastrointestinal, Neurological, Skin, Genitourinary, Musculoskeletal, HENT, Endocrine, Cardiovascular, Respiratory, Psychiatric, Allergic/Imm, Heme/Lymph   Last edited by Roselee Nova D on 08/28/2018  9:04 AM. (History)       ALLERGIES No Known Allergies  PAST MEDICAL HISTORY Past Medical History:  Diagnosis Date  . Tobacco abuse    History reviewed. No pertinent surgical history.  FAMILY HISTORY Family History  Problem Relation Age of Onset  . Diabetes Brother     SOCIAL HISTORY Social History   Tobacco Use  . Smoking status: Current Every Day Smoker    Types: Cigarettes  . Smokeless tobacco: Never Used  Substance Use Topics  . Alcohol use: Yes  . Drug use: Yes    Types: Cocaine         OPHTHALMIC EXAM:  Base Eye Exam    Visual Acuity (Snellen - Linear)      Right Left   Dist cc 20/40 20/250   Dist ph cc 20/25 +2 20/80 -1  Eccentric fixation OS       Tonometry (Tonopen, 9:19 AM)      Right  Left   Pressure 16 16       Pupils      Dark Light Shape React APD   Right 2 1 Round Brisk None   Left 2 1 Round Brisk None       Visual Fields (Counting fingers)      Left Right    Full Full       Extraocular Movement      Right Left    Full, Ortho Full, Ortho       Neuro/Psych    Oriented x3:  Yes   Mood/Affect:  Normal       Dilation    Both eyes:  1.0% Mydriacyl, 2.5% Phenylephrine @ 9:19 AM        Slit Lamp and Fundus Exam    Slit Lamp Exam      Right Left   Lids/Lashes Dermatochalasis - upper lid, mild Dermatochalasis - upper lid, mild   Conjunctiva/Sclera Pinguecula temporal, Melanosis  Pinguecula nasaly and temporal, Melanosis   Cornea 1+ Punctate epithelial erosions 1-2+ Punctate epithelial erosions   Anterior Chamber Deep, no cell/flare; narrow temporal angle deep, clear, narrow temporal angle   Iris Round and dilated to 33mm Round and dilated to 13mm   Lens 1-2+ NS, 2+ cortical 2-3+ NS, 2+ cortical, 3+ASC. 3-4+PSC   Vitreous Vitreous syneresis Normal       Fundus Exam      Right Left   Disc Pink and Sharp hazy view; perfused   C/D Ratio 0.4 0.3   Macula Flat, Mottling, Good foveal reflex hazy view due to cataract--grossly flat, good foveal reflex   Vessels mild Vascular attenuation, tortuous, Copper wiring hazy view due to cataract-- mild Vascular attenuation, tortuous   Periphery Attached , No heme  Hazy view; grossly attached, No heme         Refraction    Wearing Rx      Sphere Cylinder Axis Add   Right +0.25 +0.50 155 +2.50   Left +1.75 Sphere  +2.50       Manifest Refraction      Sphere Cylinder Axis Dist VA   Right +0.75 +0.50 170 20/20-1   Left +1.00 sph  20/200-2          IMAGING AND PROCEDURES  Imaging and Procedures for @TODAY @  OCT, Retina - OU - Both Eyes       Right Eye Quality was good. Central Foveal Thickness: 226. Progression has no prior data. Findings include normal foveal contour, no SRF, no IRF (Trace cystic changes nasal macula, central flattening of foveal contour).   Left Eye Quality was poor. Central Foveal Thickness: 260. Progression has no prior data. Findings include normal foveal contour, no SRF, no IRF.   Notes *Images captured and stored on drive  Diagnosis / Impression:  NFP, no IRF/SRF OU   Clinical management:  See below  Abbreviations: NFP - Normal foveal profile. CME - cystoid macular edema. PED - pigment epithelial detachment. IRF - intraretinal fluid. SRF - subretinal fluid. EZ - ellipsoid zone. ERM - epiretinal membrane. ORA - outer retinal atrophy. ORT - outer retinal tubulation. SRHM - subretinal  hyper-reflective material                 ASSESSMENT/PLAN:    ICD-10-CM   1. Combined forms of age-related cataract of both eyes H25.813   2. Retinal edema H35.81 OCT, Retina - OU - Both Eyes  3. Hypertensive retinopathy of both eyes H35.033  1. Mixed form age related cataract OU (OS>>>OD) - BCVA OD 20/20; OS 20/80 - significant glare symptoms OS > OD - OS with significant PSC (3-4+) causing majority of decreased vision - denies trauma or history of steroid use - The symptoms of cataract, surgical options, and treatments and risks were discussed with patient. - discussed diagnosis and progression - will refer to Dr. Midge Aver for cataract eval - f/u here PRN  2. No retinal edema on exam or OCT  3. Hypertensive retinopathy OU - does not carry formal diagnosis of HTN, but +smoking history - discussed importance of tight BP control - monitor   Ophthalmic Meds Ordered this visit:  No orders of the defined types were placed in this encounter.      Return if symptoms worsen or fail to improve.  There are no Patient Instructions on file for this visit.   Explained the diagnoses, plan, and follow up with the patient and they expressed understanding.  Patient expressed understanding of the importance of proper follow up care.  This document serves as a record of services personally performed by Gardiner Sleeper, MD, PhD. It was created on their behalf by Ernest Mallick, OA, an ophthalmic assistant. The creation of this record is the provider's dictation and/or activities during the visit.    Electronically signed by: Ernest Mallick, OA  05.06.2020 12:32 PM     Gardiner Sleeper, M.D., Ph.D. Diseases & Surgery of the Retina and Vitreous [facility]  I have reviewed the above documentation for accuracy and completeness, and I agree with the above. Gardiner Sleeper, M.D., Ph.D. 08/28/18 12:32 PM     Abbreviations: M myopia (nearsighted); A  astigmatism; H hyperopia (farsighted); P presbyopia; Mrx spectacle prescription;  CTL contact lenses; OD right eye; OS left eye; OU both eyes  XT exotropia; ET esotropia; PEK punctate epithelial keratitis; PEE punctate epithelial erosions; DES dry eye syndrome; MGD meibomian gland dysfunction; ATs artificial tears; PFAT's preservative free artificial tears; [place] nuclear sclerotic cataract; PSC posterior subcapsular cataract; ERM epi-retinal membrane; PVD posterior vitreous detachment; RD retinal detachment; DM diabetes mellitus; DR diabetic retinopathy; NPDR non-proliferative diabetic retinopathy; PDR proliferative diabetic retinopathy; CSME clinically significant macular edema; DME diabetic macular edema; dbh dot blot hemorrhages; CWS cotton wool spot; POAG primary open angle glaucoma; C/D cup-to-disc ratio; HVF humphrey visual field; GVF goldmann visual field; OCT optical coherence tomography; IOP intraocular pressure; BRVO Branch retinal vein occlusion; CRVO central retinal vein occlusion; CRAO central retinal artery occlusion; BRAO branch retinal artery occlusion; RT retinal tear; SB scleral buckle; PPV pars plana vitrectomy; VH Vitreous hemorrhage; PRP panretinal laser photocoagulation; IVK intravitreal kenalog; VMT vitreomacular traction; MH Macular hole;  NVD neovascularization of the disc; NVE neovascularization elsewhere; AREDS age related eye disease study; ARMD age related macular degeneration; POAG primary open angle glaucoma; EBMD epithelial/anterior basement membrane dystrophy; ACIOL anterior chamber intraocular lens; IOL intraocular lens; PCIOL posterior chamber intraocular lens; Phaco/IOL phacoemulsification with intraocular lens placement; [place] photorefractive keratectomy; LASIK laser assisted in situ keratomileusis; HTN hypertension; DM diabetes mellitus; COPD chronic obstructive pulmonary disease

## 2018-08-28 ENCOUNTER — Other Ambulatory Visit: Payer: Self-pay

## 2018-08-28 ENCOUNTER — Ambulatory Visit (INDEPENDENT_AMBULATORY_CARE_PROVIDER_SITE_OTHER): Payer: Self-pay | Admitting: Ophthalmology

## 2018-08-28 ENCOUNTER — Encounter (INDEPENDENT_AMBULATORY_CARE_PROVIDER_SITE_OTHER): Payer: Self-pay | Admitting: Ophthalmology

## 2018-08-28 DIAGNOSIS — H25813 Combined forms of age-related cataract, bilateral: Secondary | ICD-10-CM

## 2018-08-28 DIAGNOSIS — H3581 Retinal edema: Secondary | ICD-10-CM

## 2018-08-28 DIAGNOSIS — H35033 Hypertensive retinopathy, bilateral: Secondary | ICD-10-CM

## 2020-04-29 ENCOUNTER — Ambulatory Visit (HOSPITAL_COMMUNITY)
Admission: EM | Admit: 2020-04-29 | Discharge: 2020-04-29 | Disposition: A | Discharge status: Home / Self Care | Payer: Self-pay | Attending: Emergency Medicine | Admitting: Emergency Medicine

## 2020-04-29 ENCOUNTER — Encounter (HOSPITAL_COMMUNITY): Payer: Self-pay

## 2020-04-29 ENCOUNTER — Other Ambulatory Visit: Payer: Self-pay

## 2020-04-29 DIAGNOSIS — Z3202 Encounter for pregnancy test, result negative: Secondary | ICD-10-CM

## 2020-04-29 DIAGNOSIS — N939 Abnormal uterine and vaginal bleeding, unspecified: Secondary | ICD-10-CM

## 2020-04-29 LAB — CBC
HCT: 34.8 % — ABNORMAL LOW (ref 36.0–46.0)
Hemoglobin: 11.7 g/dL — ABNORMAL LOW (ref 12.0–15.0)
MCH: 30.4 pg (ref 26.0–34.0)
MCHC: 33.6 g/dL (ref 30.0–36.0)
MCV: 90.4 fL (ref 80.0–100.0)
Platelets: 258 10*3/uL (ref 150–400)
RBC: 3.85 MIL/uL — ABNORMAL LOW (ref 3.87–5.11)
RDW: 14.4 % (ref 11.5–15.5)
WBC: 6.8 10*3/uL (ref 4.0–10.5)
nRBC: 0 % (ref 0.0–0.2)

## 2020-04-29 LAB — POC URINE PREG, ED: Preg Test, Ur: NEGATIVE

## 2020-04-29 MED ORDER — MEGESTROL ACETATE 40 MG PO TABS: 40.0000 mg | ORAL_TABLET | Freq: Two times a day (BID) | ORAL | 0 refills | Status: AC

## 2020-04-29 NOTE — ED Triage Notes (Addendum)
Pt in with c/o heavy vaginal bleeding that has been going on for the past 2 days. Also c/o lower abdominal pain.  States that her cycle is usually long but she never has bled this heavy. Passed a big clot of blood a few nights ago.  States she tried to take tylenol with no relief  States that she has been dizzy as well  Also c/o numbness to back of her right buttock down to back of her knee

## 2020-04-29 NOTE — ED Provider Notes (Signed)
[facility]    CSN: [id number] Arrival date & time: 04/29/20  0945      History   Chief Complaint Chief Complaint  Patient presents with  . vaginal bleeding  . Numbness    HPI [name] is a 50 y.o. female.   [name] presents with complaints of increased vaginal bleeding. She has had bleeding for the past two weeks, but has passed large clots which started two days ago. She has some dizziness. Last night with pelvic pain, this has significantly decreased today. No urinary symptoms. Vomited yesterday. No further gi symptoms. Has had irregular bleeding for years now. Sexually active with her husband. Doesn't follow with a gynecologist. She is not on birth control. States that her last PAP was two years ago. Per chart review has had history of leiomyoma and vaginal bleeding.    ROS per HPI, negative if not otherwise mentioned.      Past Medical History:  Diagnosis Date  . Tobacco abuse     Patient Active Problem List   Diagnosis Date Noted  . Abnormal uterine bleeding 05/26/2015    History reviewed. No pertinent surgical history.  OB History   No obstetric history on file.      Home Medications    Prior to Admission medications   Medication Sig Start Date End Date Taking? Authorizing Provider  megestrol (MEGACE) 40 MG tablet Take 1 tablet (40 mg total) by mouth 2 (two) times daily for 10 days. 04/29/20 05/09/20 Yes Zigmund Gottron, NP  predniSONE (DELTASONE) 20 MG tablet Two daily with food Patient not taking: Reported on 08/28/2018 07/03/17   Robyn Haber, MD    Family History Family History  Problem Relation Age of Onset  . Diabetes Brother     Social History Social History   Tobacco Use  . Smoking status: Current Every Day Smoker    Types: Cigarettes  . Smokeless tobacco: Never Used  Substance Use Topics  . Alcohol use: Yes  . Drug use: Yes    Types: Cocaine     Allergies   Patient has no known  allergies.   Review of Systems Review of Systems   Physical Exam Triage Vital Signs ED Triage Vitals  Enc Vitals Group     BP 04/29/20 1152 (!) 160/100     Pulse Rate 04/29/20 1152 85     Resp 04/29/20 1152 20     Temp 04/29/20 1152 98.2 F (36.8 C)     Temp src --      SpO2 04/29/20 1152 100 %     Weight --      Height --      Head Circumference --      Peak Flow --      Pain Score 04/29/20 1150 6     Pain Loc --      Pain Edu? --      Excl. in [place]? --    No data found.  Updated Vital Signs BP (!) 160/100   Pulse 85   Temp 98.2 F (36.8 C)   Resp 20   LMP 04/20/2020 (Approximate)   SpO2 100%   Visual Acuity Right Eye Distance:   Left Eye Distance:   Bilateral Distance:    Right Eye Near:   Left Eye Near:    Bilateral Near:     Physical Exam Constitutional:      General: She is not in acute distress.    Appearance: She is well-developed.  Cardiovascular:  Rate and Rhythm: Normal rate.  Pulmonary:     Effort: Pulmonary effort is normal.  Abdominal:     Tenderness: There is no abdominal tenderness.  Genitourinary:    Vagina: Bleeding present.     Cervix: Cervical bleeding present.     Comments: Moderate vaginal bleeding Skin:    General: Skin is warm and dry.  Neurological:     Mental Status: She is alert and oriented to person, place, and time.      UC Treatments / Results  Labs (all labs ordered are listed, but only abnormal results are displayed) Labs Reviewed  CBC  POC URINE PREG, ED  CERVICOVAGINAL ANCILLARY ONLY    EKG   Radiology No results found.  Procedures Procedures (including critical care time)  Medications Ordered in UC Medications - No data to display  Initial Impression / Assessment and Plan / UC Course  I have reviewed the triage vital signs and the nursing notes.  Pertinent labs & imaging results that were available during my care of the patient were reviewed by me and considered in my medical decision  making (see chart for details).      Vaginal bleeding for 2 weeks, has become heavy. Exam is overall reassuring, cbc pending. Vitals stable, blood pressure elevated even. Megace provided. Gyne follow up recommendations provided. Return precautions provided. Patient verbalized understanding and agreeable to plan.   Final Clinical Impressions(s) / UC Diagnoses   Final diagnoses:  Abnormal vaginal bleeding     Discharge Instructions     I will call you if there are any concerning findings with any of your testing.  Please start the prescribed medications to help stop bleeding.  You may use ibuprofen or naproxen to help with pain.  Please follow up with gynecologist for recheck and further management.  [facility] any worsening of symptoms.    ED Prescriptions    Medication Sig Dispense Auth. Provider   megestrol (MEGACE) 40 MG tablet Take 1 tablet (40 mg total) by mouth 2 (two) times daily for 10 days. 20 tablet Zigmund Gottron, NP     PDMP not reviewed this encounter.   Zigmund Gottron, NP 04/29/20 1313

## 2020-04-29 NOTE — Discharge Instructions (Addendum)
I will call you if there are any concerning findings with any of your testing.  Please start the prescribed medications to help stop bleeding.  You may use ibuprofen or naproxen to help with pain.  Please follow up with gynecologist for recheck and further management.  [facility] for any worsening of symptoms.

## 2020-05-02 LAB — CERVICOVAGINAL ANCILLARY ONLY
Bacterial Vaginitis (gardnerella): POSITIVE — AB
Candida Glabrata: NEGATIVE
Candida Vaginitis: NEGATIVE
Chlamydia: NEGATIVE
Comment: NEGATIVE
Comment: NEGATIVE
Comment: NEGATIVE
Comment: NEGATIVE
Comment: NEGATIVE
Comment: NORMAL
Neisseria Gonorrhea: NEGATIVE
Trichomonas: NEGATIVE

## 2020-05-03 ENCOUNTER — Telehealth (HOSPITAL_COMMUNITY): Payer: Self-pay

## 2020-05-03 MED ORDER — METRONIDAZOLE 500 MG PO TABS: 500.0000 mg | ORAL_TABLET | Freq: Two times a day (BID) | ORAL | 0 refills | Status: DC

## 2020-12-25 ENCOUNTER — Encounter (HOSPITAL_COMMUNITY): Payer: Self-pay | Admitting: Emergency Medicine

## 2020-12-25 ENCOUNTER — Ambulatory Visit (HOSPITAL_COMMUNITY)
Admission: EM | Admit: 2020-12-25 | Discharge: 2020-12-25 | Payer: 59 | Attending: Emergency Medicine | Admitting: Emergency Medicine

## 2020-12-25 ENCOUNTER — Emergency Department (HOSPITAL_COMMUNITY)
Admission: EM | Admit: 2020-12-25 | Discharge: 2020-12-25 | Disposition: A | Discharge status: Home / Self Care | Payer: 59 | Attending: Emergency Medicine | Admitting: Emergency Medicine

## 2020-12-25 ENCOUNTER — Encounter (HOSPITAL_COMMUNITY): Payer: Self-pay

## 2020-12-25 ENCOUNTER — Emergency Department (HOSPITAL_COMMUNITY): Payer: 59

## 2020-12-25 DIAGNOSIS — R0789 Other chest pain: Secondary | ICD-10-CM

## 2020-12-25 DIAGNOSIS — I161 Hypertensive emergency: Secondary | ICD-10-CM | POA: Diagnosis not present

## 2020-12-25 DIAGNOSIS — M79602 Pain in left arm: Secondary | ICD-10-CM | POA: Diagnosis not present

## 2020-12-25 DIAGNOSIS — I259 Chronic ischemic heart disease, unspecified: Secondary | ICD-10-CM | POA: Diagnosis not present

## 2020-12-25 DIAGNOSIS — R079 Chest pain, unspecified: Secondary | ICD-10-CM | POA: Diagnosis present

## 2020-12-25 DIAGNOSIS — F17210 Nicotine dependence, cigarettes, uncomplicated: Secondary | ICD-10-CM | POA: Diagnosis not present

## 2020-12-25 DIAGNOSIS — R071 Chest pain on breathing: Secondary | ICD-10-CM | POA: Insufficient documentation

## 2020-12-25 LAB — BASIC METABOLIC PANEL
Anion gap: 9 (ref 5–15)
BUN: 15 mg/dL (ref 6–20)
CO2: 20 mmol/L — ABNORMAL LOW (ref 22–32)
Calcium: 8.8 mg/dL — ABNORMAL LOW (ref 8.9–10.3)
Chloride: 106 mmol/L (ref 98–111)
Creatinine, Ser: 0.87 mg/dL (ref 0.44–1.00)
GFR, Estimated: >60 mL/min (ref >60)
Glucose, Bld: 111 mg/dL — ABNORMAL HIGH (ref 70–99)
Potassium: 4 mmol/L (ref 3.5–5.1)
Sodium: 135 mmol/L (ref 135–145)

## 2020-12-25 LAB — CBC
HCT: 37.8 % (ref 36.0–46.0)
Hemoglobin: 11.6 g/dL — ABNORMAL LOW (ref 12.0–15.0)
MCH: 23.4 pg — ABNORMAL LOW (ref 26.0–34.0)
MCHC: 30.7 g/dL (ref 30.0–36.0)
MCV: 76.2 fL — ABNORMAL LOW (ref 80.0–100.0)
Platelets: 285 10*3/uL (ref 150–400)
RBC: 4.96 MIL/uL (ref 3.87–5.11)
RDW: 21 % — ABNORMAL HIGH (ref 11.5–15.5)
WBC: 6.9 10*3/uL (ref 4.0–10.5)
nRBC: 0 % (ref 0.0–0.2)

## 2020-12-25 LAB — TROPONIN I (HIGH SENSITIVITY): Troponin I (High Sensitivity): 7 ng/L (ref <18)

## 2020-12-25 MED ORDER — IBUPROFEN 400 MG PO TABS
600.0000 mg | ORAL_TABLET | Freq: Once | ORAL | Status: AC
  Administered 2020-12-25: 600 mg via ORAL
  Filled 2020-12-25: qty 1

## 2020-12-25 MED ORDER — LIDOCAINE 5 % EX PTCH: 1.0000 | MEDICATED_PATCH | CUTANEOUS | 0 refills | Status: DC

## 2020-12-25 MED ORDER — IBUPROFEN 600 MG PO TABS: 600.0000 mg | ORAL_TABLET | Freq: Four times a day (QID) | ORAL | 0 refills | Status: DC | PRN

## 2020-12-25 NOTE — ED Notes (Signed)
Patient transported to X-ray 

## 2020-12-25 NOTE — ED Provider Notes (Signed)
[facility] EMERGENCY DEPARTMENT Provider Note   CSN: [id number] Arrival date & time: 12/25/20  1152     History Chief Complaint  Patient presents with   Arm Pain    [name] is a 50 y.o. female.  Pt is a 50 yo female presenting for chest pain. Pt admits to left sternal chest pain radiating to the left arm that started this morning with associated nausea. Denies sob, fever, chills, or coughing. Denies recent falls or injuries. States pain is worse with arm movement.   The history is provided by the patient. No language interpreter was used.  Arm Pain This is a new problem. The problem occurs constantly. The problem has not changed since onset.Associated symptoms include chest pain. Pertinent negatives include no abdominal pain and no shortness of breath.      Past Medical History:  Diagnosis Date   Tobacco abuse     Patient Active Problem List   Diagnosis Date Noted   Abnormal uterine bleeding 05/26/2015    History reviewed. No pertinent surgical history.   OB History   No obstetric history on file.     Family History  Problem Relation Age of Onset   Diabetes Brother     Social History   Tobacco Use   Smoking status: Every Day    Types: Cigarettes   Smokeless tobacco: Never  Substance Use Topics   Alcohol use: Yes   Drug use: Yes    Types: Cocaine    Home Medications Prior to Admission medications   Medication Sig Start Date End Date Taking? Authorizing Provider  metroNIDAZOLE (FLAGYL) 500 MG tablet Take 1 tablet (500 mg total) by mouth 2 (two) times daily. 05/03/20   Chase Picket, MD  predniSONE (DELTASONE) 20 MG tablet Two daily with food Patient not taking: Reported on 08/28/2018 07/03/17   Robyn Haber, MD    Allergies    Patient has no known allergies.  Review of Systems   Review of Systems  Constitutional:  Negative for chills and fever.  HENT:  Negative for ear pain and sore throat.   Eyes:  Negative  for pain and visual disturbance.  Respiratory:  Negative for cough and shortness of breath.   Cardiovascular:  Positive for chest pain. Negative for palpitations.  Gastrointestinal:  Negative for abdominal pain and vomiting.  Genitourinary:  Negative for dysuria and hematuria.  Musculoskeletal:  Negative for arthralgias and back pain.  Skin:  Negative for color change and rash.  Neurological:  Negative for seizures and syncope.  All other systems reviewed and are negative.  Physical Exam Updated Vital Signs BP (!) 188/101 (BP Location: Right Arm)   Pulse 80   Temp 98.2 F (36.8 C) (Oral)   Resp 18   SpO2 92%   Physical Exam Vitals and nursing note reviewed.  Constitutional:      General: She is not in acute distress.    Appearance: She is well-developed.  HENT:     Head: Normocephalic and atraumatic.  Eyes:     Conjunctiva/sclera: Conjunctivae normal.  Cardiovascular:     Rate and Rhythm: Normal rate and regular rhythm.     Heart sounds: No murmur heard. Pulmonary:     Effort: Pulmonary effort is normal. No respiratory distress.     Breath sounds: Normal breath sounds.  Chest:     Chest wall: Tenderness present.    Abdominal:     Palpations: Abdomen is soft.     Tenderness: There is  no abdominal tenderness.  Musculoskeletal:     Cervical back: Neck supple.  Skin:    General: Skin is warm and dry.  Neurological:     Mental Status: She is alert.    ED Results / Procedures / Treatments   Labs (all labs ordered are listed, but only abnormal results are displayed) Labs Reviewed  BASIC METABOLIC PANEL  CBC  TROPONIN I (HIGH SENSITIVITY)    EKG None  Radiology No results found.  Procedures Procedures   Medications Ordered in ED Medications  ibuprofen (ADVIL) tablet 600 mg (has no administration in time range)    ED Course  I have reviewed the triage vital signs and the nursing notes.  Pertinent labs & imaging results that were available during my  care of the patient were reviewed by me and considered in my medical decision making (see chart for details).    MDM Rules/Calculators/A&P                           12:49 PM 50 yo female presenting for chest pain. Pt is Aox3, no acute distress, afebrile, with stable vitals. Physical exam demonstrates reproducible left sided chest wall pain. EKG demonstrates normal sinus rhythm, stable troponin, cxr, and electrolytes.  Pt pain reproducible and likely musculoskeletal in nature. Medication given for symptomatic management. Patient in no distress and overall condition improved here in the ED. Detailed discussions were had with the patient regarding current findings, and need for close f/u with PCP or on call doctor. The patient has been instructed to return immediately if the symptoms worsen in any way for re-evaluation. Patient verbalized understanding and is in agreement with current care plan. All questions answered prior to discharge.         Final Clinical Impression(s) / ED Diagnoses Final diagnoses:  Costochondral chest pain    Rx / DC Orders ED Discharge Orders     None        Lianne Cure, DO [id number]

## 2020-12-25 NOTE — ED Triage Notes (Signed)
Pt reports pain to posterior upper arms and pain to L lateral chest that started this morning with nausea.  Denies SOB and vomiting.

## 2020-12-25 NOTE — ED Triage Notes (Signed)
Pt presents bilateral arm pain and pain in left underarm since waking up this morning.

## 2020-12-25 NOTE — ED Notes (Signed)
Patient left without receiving d/c paperwork while this RN was with a code stroke. D/c education given by provider prior to d/c.

## 2020-12-25 NOTE — ED Provider Notes (Signed)
[facility]    CSN: [id number] Arrival date & time: 12/25/20  1040      History   Chief Complaint Chief Complaint  Patient presents with   Arm Pain    HPI [name] is a 50 y.o. female.   Patient complains of pain in back of both upper arms that is persistent and has been worsening for several hours.  Patient denies frank chest pain and shortness of breath however per my observation patient is exhibiting increased work of breathing.  Patient admits to feeling anxious at this time.  States her mother has had 2 heart attacks.  Patient denies a history of hypertension, has never been treated for elevated blood pressure.  The history is provided by the patient.  Chest Pain Pain location:  L lateral chest and R lateral chest Pain quality: aching, pressure and sharp   Pain radiates to:  Does not radiate Pain severity:  Severe Onset quality:  Gradual Duration:  4 hours Timing:  Constant Progression:  Worsening Chronicity:  New Context: at rest   Relieved by:  None tried Worsened by:  Nothing Ineffective treatments:  None tried Associated symptoms: anxiety, dizziness and shortness of breath   Risk factors: hypertension and smoking    Past Medical History:  Diagnosis Date   Tobacco abuse     Patient Active Problem List   Diagnosis Date Noted   Abnormal uterine bleeding 05/26/2015    History reviewed. No pertinent surgical history.  OB History   No obstetric history on file.      Home Medications    Prior to Admission medications   Medication Sig Start Date End Date Taking? Authorizing Provider  metroNIDAZOLE (FLAGYL) 500 MG tablet Take 1 tablet (500 mg total) by mouth 2 (two) times daily. 05/03/20   Chase Picket, MD  predniSONE (DELTASONE) 20 MG tablet Two daily with food Patient not taking: Reported on 08/28/2018 07/03/17   Robyn Haber, MD    Family History Family History  Problem Relation Age of Onset   Diabetes Brother      Social History Social History   Tobacco Use   Smoking status: Every Day    Types: Cigarettes   Smokeless tobacco: Never  Substance Use Topics   Alcohol use: Yes   Drug use: Yes    Types: Cocaine     Allergies   Patient has no known allergies.   Review of Systems Review of Systems  Respiratory:  Positive for shortness of breath.   Cardiovascular:  Positive for chest pain.  Neurological:  Positive for dizziness.  All other systems reviewed and are negative.   Physical Exam Triage Vital Signs ED Triage Vitals  Enc Vitals Group     BP 12/25/20 1123 (!) 192/103     Pulse Rate 12/25/20 1123 72     Resp 12/25/20 1123 18     Temp 12/25/20 1123 97.8 F (36.6 C)     Temp Source 12/25/20 1123 Oral     SpO2 12/25/20 1123 98 %     Weight --      Height --      Head Circumference --      Peak Flow --      Pain Score 12/25/20 1128 9     Pain Loc --      Pain Edu? --      Excl. in [place]? --    No data found.  Updated Vital Signs BP (!) 192/103 (BP Location: Right Arm)  Pulse 72   Temp 97.8 F (36.6 C) (Oral)   Resp 18   SpO2 98%   Visual Acuity Right Eye Distance:   Left Eye Distance:   Bilateral Distance:    Right Eye Near:   Left Eye Near:    Bilateral Near:     Physical Exam Constitutional:      General: She is in acute distress.  HENT:     Head: Normocephalic and atraumatic.  Cardiovascular:     Rate and Rhythm: Normal rate and regular rhythm.     Pulses: Normal pulses.     Heart sounds: Normal heart sounds. No murmur heard.   No gallop.  Pulmonary:     Breath sounds: Normal breath sounds. No wheezing, rhonchi or rales.     Comments: Mildly increased work of breathing Abdominal:     General: Abdomen is flat. Bowel sounds are normal. There is no distension.     Palpations: Abdomen is soft.     Tenderness: There is no abdominal tenderness. There is no guarding.  Skin:    General: Skin is warm and dry.  Neurological:     General: No focal  deficit present.     Mental Status: She is alert and oriented to person, place, and time.  Psychiatric:        Mood and Affect: Mood normal.        Behavior: Behavior normal.        Thought Content: Thought content normal.        Judgment: Judgment normal.     UC Treatments / Results  Labs (all labs ordered are listed, but only abnormal results are displayed) Labs Reviewed - No data to display  EKG   Radiology No results found.  Procedures ED EKG  Date/Time: 12/25/2020 11:53 AM Performed by: Lynden Oxford, PA-C Authorized by: Lynden Oxford, PA-C   Previous ECG:    Previous ECG:  Unavailable Interpretation:    Interpretation: abnormal   Rate:    ECG rate assessment: normal   Rhythm:    Rhythm: sinus rhythm   Ectopy:    Ectopy: none   QRS:    QRS axis:  Normal   QRS intervals:  Normal   QRS conduction: normal   ST segments:    ST segments:  Normal T waves:    T waves: inverted     Details:  Biphasic in V5   Inverted:  V3 and V4 Q waves:    Abnormal Q-waves: present     Q waves:  AVL Comments:     Anterior ischemia present (including critical care time)  Medications Ordered in UC Medications - No data to display  Initial Impression / Assessment and Plan / UC Course  I have reviewed the triage vital signs and the nursing notes.  Pertinent labs & imaging results that were available during my care of the patient were reviewed by me and considered in my medical decision making (see chart for details).     Patient is accompanied by her mother today.  Patient advised of results of EKG.  I recommend she go to the emergency room now for further work-up including elevation of troponin levels and further EKG monitoring.  Patient and mom agreed to go now.  Despite elevated blood pressure at this time, I feel patient is stable to make the short journey across the street to the emergency room.  Patient verbalized agreement with plan.  All questions addressed. Final  Clinical Impressions(s) / UC  Diagnoses   Final diagnoses:  Cardiac ischemia  Hypertensive emergency  Other chest pain     Discharge Instructions      OtherYour EKG today is abnormal and concerning for possible heart attack.  Please go to the emergency room now work-up and possible treatment.     ED Prescriptions   None    PDMP not reviewed this encounter.   Lynden Oxford, PA-C 12/25/20 1200

## 2020-12-25 NOTE — Discharge Instructions (Addendum)
OtherYour EKG today is abnormal and concerning for possible heart attack.  Please go to the emergency room now work-up and possible treatment.

## 2021-08-14 ENCOUNTER — Emergency Department (HOSPITAL_COMMUNITY)
Admission: EM | Admit: 2021-08-14 | Discharge: 2021-08-14 | Discharge status: Against Medical Advice | Payer: 59 | Attending: Emergency Medicine | Admitting: Emergency Medicine

## 2021-08-14 ENCOUNTER — Emergency Department (HOSPITAL_COMMUNITY): Payer: 59

## 2021-08-14 ENCOUNTER — Other Ambulatory Visit: Payer: Self-pay

## 2021-08-14 DIAGNOSIS — M25561 Pain in right knee: Secondary | ICD-10-CM | POA: Diagnosis not present

## 2021-08-14 DIAGNOSIS — M25531 Pain in right wrist: Secondary | ICD-10-CM | POA: Diagnosis not present

## 2021-08-14 DIAGNOSIS — W010XXA Fall on same level from slipping, tripping and stumbling without subsequent striking against object, initial encounter: Secondary | ICD-10-CM | POA: Diagnosis not present

## 2021-08-14 DIAGNOSIS — Z5321 Procedure and treatment not carried out due to patient leaving prior to being seen by health care provider: Secondary | ICD-10-CM | POA: Diagnosis not present

## 2021-08-14 NOTE — ED Notes (Signed)
Patient left without being seen, states "I am officially leaving this time, I have to go." ?

## 2021-08-14 NOTE — ED Provider Triage Note (Signed)
Emergency Medicine Provider Triage Evaluation Note ? ?[name] , a 51 y.o. female  was evaluated in triage.  Pt complains of right wrist, right knee, and thoracic back pain after suffering a fall.  Patient reports that yesterday evening approximately 7:30 PM she tripped over produce cart landing on the floor.  Patient denies hitting her head or any loss of consciousness. ? ?Patient is right-hand dominant. ? ?Review of Systems  ?Positive: Right wrist pain, right knee pain, thoracic back pain ?Negative: Numbness, weakness, saddle anesthesia, bowel/bladder dysfunction, syncope, neck pain ? ?Physical Exam  ?BP (!) 152/91 (BP Location: Left Arm)   Pulse 76   Temp 98.4 ?F (36.9 ?C) (Oral)   Resp 17   SpO2 100%  ?Gen:   Awake, no distress   ?Resp:  Normal effort  ?MSK:   Moves extremities without difficulty  ?Other:  No midline tenderness deformity to cervical, thoracic, or lumbar spine.  Bilateral thoracic paraspinous tenderness.  Tenderness to right wrist.  No tenderness to anatomical snuffbox.  Right knee is in a brace. ? ?Medical Decision Making  ?Medically screening exam initiated at 1:34 PM.  Appropriate orders placed.  [name] was informed that the remainder of the evaluation will be completed by another provider, this initial triage assessment does not replace that evaluation, and the importance of remaining in the ED until their evaluation is complete. ? ? ?  ?Loni Beckwith, PA-C ?08/14/21 1335 ? ?

## 2021-08-14 NOTE — ED Triage Notes (Signed)
Pt tripped over a produce cart at [employer] yesterday and has pain to R knee and R wrist. No LOC and did not hit head.  ?

## 2021-08-14 NOTE — ED Notes (Signed)
Registration told this tech that patient left to go to another appt but "is coming back" ?

## 2023-07-18 ENCOUNTER — Ambulatory Visit (INDEPENDENT_AMBULATORY_CARE_PROVIDER_SITE_OTHER): Admitting: Primary Care

## 2023-08-03 ENCOUNTER — Emergency Department (HOSPITAL_COMMUNITY)

## 2023-08-03 ENCOUNTER — Observation Stay (HOSPITAL_COMMUNITY)
Admission: EM | Admit: 2023-08-03 | Discharge: 2023-08-04 | Disposition: A | Discharge status: Home / Self Care | Attending: Internal Medicine | Admitting: Internal Medicine

## 2023-08-03 ENCOUNTER — Encounter (HOSPITAL_COMMUNITY): Payer: Self-pay | Admitting: Pharmacy Technician

## 2023-08-03 ENCOUNTER — Other Ambulatory Visit: Payer: Self-pay

## 2023-08-03 DIAGNOSIS — Z79899 Other long term (current) drug therapy: Secondary | ICD-10-CM | POA: Insufficient documentation

## 2023-08-03 DIAGNOSIS — I10 Essential (primary) hypertension: Secondary | ICD-10-CM | POA: Insufficient documentation

## 2023-08-03 DIAGNOSIS — E1110 Type 2 diabetes mellitus with ketoacidosis without coma: Secondary | ICD-10-CM | POA: Insufficient documentation

## 2023-08-03 DIAGNOSIS — Z794 Long term (current) use of insulin: Secondary | ICD-10-CM | POA: Diagnosis not present

## 2023-08-03 DIAGNOSIS — R944 Abnormal results of kidney function studies: Secondary | ICD-10-CM | POA: Diagnosis not present

## 2023-08-03 DIAGNOSIS — E872 Acidosis, unspecified: Secondary | ICD-10-CM | POA: Diagnosis not present

## 2023-08-03 DIAGNOSIS — E1100 Type 2 diabetes mellitus with hyperosmolarity without nonketotic hyperglycemic-hyperosmolar coma (NKHHC): Secondary | ICD-10-CM | POA: Diagnosis not present

## 2023-08-03 DIAGNOSIS — R319 Hematuria, unspecified: Secondary | ICD-10-CM | POA: Diagnosis not present

## 2023-08-03 DIAGNOSIS — D259 Leiomyoma of uterus, unspecified: Secondary | ICD-10-CM | POA: Diagnosis not present

## 2023-08-03 DIAGNOSIS — N939 Abnormal uterine and vaginal bleeding, unspecified: Secondary | ICD-10-CM | POA: Diagnosis present

## 2023-08-03 DIAGNOSIS — E1165 Type 2 diabetes mellitus with hyperglycemia: Secondary | ICD-10-CM | POA: Diagnosis not present

## 2023-08-03 DIAGNOSIS — E119 Type 2 diabetes mellitus without complications: Secondary | ICD-10-CM

## 2023-08-03 DIAGNOSIS — R5383 Other fatigue: Secondary | ICD-10-CM | POA: Diagnosis present

## 2023-08-03 DIAGNOSIS — Z8679 Personal history of other diseases of the circulatory system: Secondary | ICD-10-CM

## 2023-08-03 LAB — CBC
HCT: 49.5 % — ABNORMAL HIGH (ref 36.0–46.0)
Hemoglobin: 16.1 g/dL — ABNORMAL HIGH (ref 12.0–15.0)
MCH: 31.2 pg (ref 26.0–34.0)
MCHC: 32.5 g/dL (ref 30.0–36.0)
MCV: 95.9 fL (ref 80.0–100.0)
Platelets: 327 10*3/uL (ref 150–400)
RBC: 5.16 MIL/uL — ABNORMAL HIGH (ref 3.87–5.11)
RDW: 13.1 % (ref 11.5–15.5)
WBC: 3.9 10*3/uL — ABNORMAL LOW (ref 4.0–10.5)
nRBC: 0 % (ref 0.0–0.2)

## 2023-08-03 LAB — I-STAT VENOUS BLOOD GAS, ED
Acid-base deficit: 1 mmol/L (ref 0.0–2.0)
Bicarbonate: 22.9 mmol/L (ref 20.0–28.0)
Calcium, Ion: 1.17 mmol/L (ref 1.15–1.40)
HCT: 45 % (ref 36.0–46.0)
Hemoglobin: 15.3 g/dL — ABNORMAL HIGH (ref 12.0–15.0)
O2 Saturation: 90 %
Potassium: 4.5 mmol/L (ref 3.5–5.1)
Sodium: 129 mmol/L — ABNORMAL LOW (ref 135–145)
TCO2: 24 mmol/L (ref 22–32)
pCO2, Ven: 36.5 mmHg — ABNORMAL LOW (ref 44–60)
pH, Ven: 7.405 (ref 7.25–7.43)
pO2, Ven: 57 mmHg — ABNORMAL HIGH (ref 32–45)

## 2023-08-03 LAB — COMPREHENSIVE METABOLIC PANEL WITH GFR
ALT: 30 U/L (ref 0–44)
AST: 25 U/L (ref 15–41)
Albumin: 4.7 g/dL (ref 3.5–5.0)
Alkaline Phosphatase: 84 U/L (ref 38–126)
Anion gap: 20 — ABNORMAL HIGH (ref 5–15)
BUN: 13 mg/dL (ref 6–20)
CO2: 24 mmol/L (ref 22–32)
Calcium: 10.9 mg/dL — ABNORMAL HIGH (ref 8.9–10.3)
Chloride: 86 mmol/L — ABNORMAL LOW (ref 98–111)
Creatinine, Ser: 1.03 mg/dL — ABNORMAL HIGH (ref 0.44–1.00)
GFR, Estimated: >60 mL/min (ref >60)
Glucose, Bld: 516 mg/dL (ref 70–99)
Potassium: 4.2 mmol/L (ref 3.5–5.1)
Sodium: 130 mmol/L — ABNORMAL LOW (ref 135–145)
Total Bilirubin: 1.4 mg/dL — ABNORMAL HIGH (ref 0.0–1.2)
Total Protein: 8.3 g/dL — ABNORMAL HIGH (ref 6.5–8.1)

## 2023-08-03 LAB — BLOOD GAS, VENOUS
Acid-Base Excess: 5.8 mmol/L — ABNORMAL HIGH (ref 0.0–2.0)
Bicarbonate: 29.8 mmol/L — ABNORMAL HIGH (ref 20.0–28.0)
Drawn by: 8029
O2 Saturation: 97.2 %
Patient temperature: 36.8
pCO2, Ven: 40 mmHg — ABNORMAL LOW (ref 44–60)
pH, Ven: 7.48 — ABNORMAL HIGH (ref 7.25–7.43)
pO2, Ven: 78 mmHg — ABNORMAL HIGH (ref 32–45)

## 2023-08-03 LAB — BASIC METABOLIC PANEL WITH GFR
Anion gap: 18 — ABNORMAL HIGH (ref 5–15)
Anion gap: 9 (ref 5–15)
BUN: 14 mg/dL (ref 6–20)
BUN: 10 mg/dL (ref 6–20)
CO2: 22 mmol/L (ref 22–32)
CO2: 23 mmol/L (ref 22–32)
Calcium: 10.2 mg/dL (ref 8.9–10.3)
Calcium: 9.3 mg/dL (ref 8.9–10.3)
Chloride: 92 mmol/L — ABNORMAL LOW (ref 98–111)
Chloride: 101 mmol/L (ref 98–111)
Creatinine, Ser: 0.9 mg/dL (ref 0.44–1.00)
Creatinine, Ser: 0.7 mg/dL (ref 0.44–1.00)
GFR, Estimated: >60 mL/min (ref >60)
GFR, Estimated: >60 mL/min (ref >60)
Glucose, Bld: 417 mg/dL — ABNORMAL HIGH (ref 70–99)
Glucose, Bld: 310 mg/dL — ABNORMAL HIGH (ref 70–99)
Potassium: 4.4 mmol/L (ref 3.5–5.1)
Potassium: 3.2 mmol/L — ABNORMAL LOW (ref 3.5–5.1)
Sodium: 132 mmol/L — ABNORMAL LOW (ref 135–145)
Sodium: 133 mmol/L — ABNORMAL LOW (ref 135–145)

## 2023-08-03 LAB — CBG MONITORING, ED
Glucose-Capillary: 495 mg/dL — ABNORMAL HIGH (ref 70–99)
Glucose-Capillary: 398 mg/dL — ABNORMAL HIGH (ref 70–99)
Glucose-Capillary: 328 mg/dL — ABNORMAL HIGH (ref 70–99)
Glucose-Capillary: 272 mg/dL — ABNORMAL HIGH (ref 70–99)
Glucose-Capillary: 196 mg/dL — ABNORMAL HIGH (ref 70–99)

## 2023-08-03 LAB — HEMOGLOBIN A1C
Hgb A1c MFr Bld: 14.5 % — ABNORMAL HIGH (ref 4.8–5.6)
Mean Plasma Glucose: 369.45 mg/dL

## 2023-08-03 LAB — BETA-HYDROXYBUTYRIC ACID: Beta-Hydroxybutyric Acid: 4.34 mmol/L — ABNORMAL HIGH (ref 0.05–0.27)

## 2023-08-03 LAB — GLUCOSE, CAPILLARY
Glucose-Capillary: 111 mg/dL — ABNORMAL HIGH (ref 70–99)
Glucose-Capillary: 150 mg/dL — ABNORMAL HIGH (ref 70–99)
Glucose-Capillary: 240 mg/dL — ABNORMAL HIGH (ref 70–99)
Glucose-Capillary: 260 mg/dL — ABNORMAL HIGH (ref 70–99)
Glucose-Capillary: 328 mg/dL — ABNORMAL HIGH (ref 70–99)
Glucose-Capillary: 75 mg/dL (ref 70–99)

## 2023-08-03 LAB — OSMOLALITY: Osmolality: 316 mosm/kg — ABNORMAL HIGH (ref 275–295)

## 2023-08-03 LAB — HCG, SERUM, QUALITATIVE: Preg, Serum: NEGATIVE

## 2023-08-03 LAB — HIV ANTIBODY (ROUTINE TESTING W REFLEX): HIV Screen 4th Generation wRfx: NONREACTIVE

## 2023-08-03 MED ORDER — DEXTROSE IN LACTATED RINGERS 5 % IV SOLN: INTRAVENOUS | Status: DC

## 2023-08-03 MED ORDER — DEXTROSE 50 % IV SOLN: 0.0000 mL | INTRAVENOUS | Status: DC | PRN

## 2023-08-03 MED ORDER — INSULIN REGULAR(HUMAN) IN NACL 100-0.9 UT/100ML-% IV SOLN
INTRAVENOUS | Status: DC
  Administered 2023-08-03: 11 [IU]/h via INTRAVENOUS
  Filled 2023-08-03: qty 100

## 2023-08-03 MED ORDER — INSULIN ASPART 100 UNIT/ML IJ SOLN
0.0000 [IU] | Freq: Three times a day (TID) | INTRAMUSCULAR | Status: DC
  Administered 2023-08-04: 3 [IU] via SUBCUTANEOUS
  Administered 2023-08-04: 9 [IU] via SUBCUTANEOUS

## 2023-08-03 MED ORDER — LACTATED RINGERS IV SOLN: INTRAVENOUS | Status: AC

## 2023-08-03 MED ORDER — LACTATED RINGERS IV BOLUS
1000.0000 mL | Freq: Once | INTRAVENOUS | Status: AC
  Administered 2023-08-03: 1000 mL via INTRAVENOUS

## 2023-08-03 MED ORDER — LACTATED RINGERS IV BOLUS
1500.0000 mL | Freq: Once | INTRAVENOUS | Status: AC
  Administered 2023-08-03: 1500 mL via INTRAVENOUS

## 2023-08-03 MED ORDER — INSULIN GLARGINE-YFGN 100 UNIT/ML ~~LOC~~ SOLN
10.0000 [IU] | Freq: Every day | SUBCUTANEOUS | Status: DC
  Filled 2023-08-03: qty 0.1

## 2023-08-03 MED ORDER — POTASSIUM CHLORIDE CRYS ER 20 MEQ PO TBCR
40.0000 meq | EXTENDED_RELEASE_TABLET | Freq: Once | ORAL | Status: AC
  Administered 2023-08-04: 40 meq via ORAL
  Filled 2023-08-03: qty 2

## 2023-08-03 MED ORDER — INSULIN GLARGINE-YFGN 100 UNIT/ML ~~LOC~~ SOLN
10.0000 [IU] | Freq: Every day | SUBCUTANEOUS | Status: DC
  Administered 2023-08-04 (×2): 10 [IU] via SUBCUTANEOUS
  Filled 2023-08-03 (×2): qty 0.1

## 2023-08-03 MED ORDER — INSULIN ASPART 100 UNIT/ML IJ SOLN
0.0000 [IU] | Freq: Every day | INTRAMUSCULAR | Status: DC
  Administered 2023-08-04: 3 [IU] via SUBCUTANEOUS
  Administered 2023-08-04: 4 [IU] via SUBCUTANEOUS

## 2023-08-03 MED ORDER — POTASSIUM CHLORIDE 10 MEQ/100ML IV SOLN
10.0000 meq | INTRAVENOUS | Status: AC
  Administered 2023-08-03 (×2): 10 meq via INTRAVENOUS
  Filled 2023-08-03 (×2): qty 100

## 2023-08-03 MED ORDER — POTASSIUM CHLORIDE 10 MEQ/100ML IV SOLN
10.0000 meq | INTRAVENOUS | Status: DC
  Administered 2023-08-03: 10 meq via INTRAVENOUS
  Filled 2023-08-03: qty 100

## 2023-08-03 MED ORDER — RIVAROXABAN 10 MG PO TABS
10.0000 mg | ORAL_TABLET | Freq: Every day | ORAL | Status: DC
  Administered 2023-08-03 – 2023-08-04 (×2): 10 mg via ORAL
  Filled 2023-08-03 (×2): qty 1

## 2023-08-03 NOTE — ED Triage Notes (Addendum)
 Pt here via POV with reports of getting sick 2 weeks ago where she lost 16 pounds in 3 days. States since then increased fatigue and has continued to lose weight. Also endorses increased thirst.

## 2023-08-03 NOTE — Hospital Course (Addendum)
 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 Pertinent Hx: HTN, AUB, fibroids  53 y.o. female p/w fatigue, weight loss, polydipsia/uria  * HHS and new DM: endotool, transition per note, working up for type I, will need PCP outpatient  Consults: none  Meds: see orders VTE ppx: doac IVF: LR Diet: CLD until off endotool

## 2023-08-03 NOTE — ED Provider Notes (Signed)
 [facility] Provider Note   CSN: [id number] Arrival date & time: 08/03/23  1057     History  Chief Complaint  Patient presents with   Fatigue    [name] is a 53 y.o. female.  This is a 53 year old female presenting to the emergency department with fatigue and generalized weakness.  Reports she got sick 3 weeks ago with generalized malaise, URI symptoms, cough and since then has felt unwell with increased fatigue.  Unintentional weight loss.  Denies URI symptoms, endorse some cough.  No shortness of breath no chest pain.  Not having abdominal pain no nausea no vomiting, no diarrhea.  No melena.  Reportedly went to urgent care 3 weeks ago and had sugar in urine.  Increased thirst and urination.          Home Medications Prior to Admission medications   Medication Sig Start Date End Date Taking? Authorizing Provider  ibuprofen (ADVIL) 200 MG tablet Take 400 mg by mouth daily as needed for headache or moderate pain (pain score 4-6).   Yes [provider]  Multiple Vitamins-Minerals (MULTIVITAMIN WOMEN) TABS Take 1 tablet by mouth daily.   Yes [provider]      Allergies    Patient has no known allergies.    Review of Systems   Review of Systems  Physical Exam Updated Vital Signs BP 127/83   Pulse 79   Temp 98.1 F (36.7 C)   Resp (!) 21   SpO2 100%  Physical Exam Vitals and nursing note reviewed.  Constitutional:      General: She is not in acute distress.    Appearance: She is not toxic-appearing.  HENT:     Head: Normocephalic.     Nose: Nose normal.     Mouth/Throat:     Mouth: Mucous membranes are moist.  Eyes:     Conjunctiva/sclera: Conjunctivae normal.  Cardiovascular:     Rate and Rhythm: Normal rate and regular rhythm.  Pulmonary:     Effort: Pulmonary effort is normal.     Breath sounds: Normal breath sounds.  Abdominal:     General: Abdomen is flat. There is no  distension.     Palpations: Abdomen is soft.     Tenderness: There is no abdominal tenderness. There is no guarding or rebound.  Musculoskeletal:        General: Normal range of motion.     Right lower leg: No edema.     Left lower leg: No edema.  Skin:    General: Skin is warm and dry.     Capillary Refill: Capillary refill takes less than 2 seconds.  Neurological:     General: No focal deficit present.     Mental Status: She is alert and oriented to person, place, and time.  Psychiatric:        Mood and Affect: Mood normal.        Behavior: Behavior normal.     ED Results / Procedures / Treatments   Labs (all labs ordered are listed, but only abnormal results are displayed) Labs Reviewed  CBC - Abnormal; Notable for the following components:      Result Value   WBC 3.9 (*)    RBC 5.16 (*)    Hemoglobin 16.1 (*)    HCT 49.5 (*)    All other components within normal limits  COMPREHENSIVE METABOLIC PANEL WITH GFR - Abnormal; Notable for the following components:   Sodium  130 (*)    Chloride 86 (*)    Glucose, Bld 516 (*)    Creatinine, Ser 1.03 (*)    Calcium 10.9 (*)    Total Protein 8.3 (*)    Total Bilirubin 1.4 (*)    Anion gap 20 (*)    All other components within normal limits  OSMOLALITY - Abnormal; Notable for the following components:   Osmolality 316 (*)    All other components within normal limits  CBG MONITORING, ED - Abnormal; Notable for the following components:   Glucose-Capillary 495 (*)    All other components within normal limits  I-STAT VENOUS BLOOD GAS, ED - Abnormal; Notable for the following components:   pCO2, Ven 36.5 (*)    pO2, Ven 57 (*)    Sodium 129 (*)    Hemoglobin 15.3 (*)    All other components within normal limits  URINALYSIS, ROUTINE W REFLEX MICROSCOPIC  BLOOD GAS, VENOUS  BETA-HYDROXYBUTYRIC ACID  BASIC METABOLIC PANEL WITH GFR  BASIC METABOLIC PANEL WITH GFR  BASIC METABOLIC PANEL WITH GFR  HCG, SERUM, QUALITATIVE  CBG  MONITORING, ED    EKG EKG Interpretation Date/Time:  Saturday August 03 2023 11:16:09 EDT Ventricular Rate:  78 PR Interval:  155 QRS Duration:  78 QT Interval:  454 QTC Calculation: 518 R Axis:   52  Text Interpretation: Sinus rhythm Right atrial enlargement Borderline T abnormalities, anterior leads Prolonged QT interval Confirmed by Elise Guile [phone]) on 08/03/2023 11:20:58 AM  Radiology DG Chest 2 View Result Date: 08/03/2023 CLINICAL DATA:  Cough. EXAM: CHEST - 2 VIEW COMPARISON:  12/25/2020 FINDINGS: The lungs are clear without focal pneumonia, edema, pneumothorax or pleural effusion. The cardiopericardial silhouette is within normal limits for size. No acute bony abnormality. Telemetry leads overlie the chest. IMPRESSION: No active cardiopulmonary disease. Electronically Signed   By: Donnal Fusi M.D.   On: 08/03/2023 11:53    Procedures .Critical Care  Performed by: Rolinda Climes, DO Authorized by: Rolinda Climes, DO   Critical care provider statement:    Critical care time (minutes):  30   Critical care was necessary to treat or prevent imminent or life-threatening deterioration of the following conditions:  Endocrine crisis   Critical care was time spent personally by me on the following activities:  Development of treatment plan with patient or surrogate, discussions with consultants, evaluation of patient's response to treatment, examination of patient, ordering and review of laboratory studies, ordering and review of radiographic studies, ordering and performing treatments and interventions, pulse oximetry, re-evaluation of patient's condition and review of old charts   Care discussed with: admitting provider       Medications Ordered in ED Medications  lactated ringers bolus 1,500 mL (has no administration in time range)  insulin regular, human (MYXREDLIN) 100 units/ 100 mL infusion (has no administration in time range)  lactated ringers infusion (has no  administration in time range)  dextrose 5 % in lactated ringers infusion (has no administration in time range)  dextrose 50 % solution 0-50 mL (has no administration in time range)  potassium chloride 10 mEq in 100 mL IVPB (has no administration in time range)  lactated ringers bolus 1,000 mL (1,000 mLs Intravenous New Bag/Given 08/03/23 1244)    ED Course/ Medical Decision Making/ A&P Clinical Course as of 08/03/23 1341  Sat Aug 03, 2023  1225 pH, Ven: 7.405 Not indicative of overt DKA. [TY]  1313 Comprehensive metabolic panel(!!) Blood sugar 516, does have anion  gap. [TY]  1313 Osmolality(!): 316 HHS type picture [TY]  1314 CBC(!) Borderline leukopenia, polycythemic.  Likely volume depletion given HHS type picture. [TY]  1314 After discussion with patient, she does not have a primary doctor that she follows up with.  Given her uncontrolled hyperglycemia/HHS type picture with lack of outpatient follow-up will plan for admission for further management of her hyperglycemia.  Started Endo tool/insulin drip. [TY]    Clinical Course User Index [TY] Rolinda Climes, DO                                 Medical Decision Making This is a well-appearing 53 year old female presenting the emergency department for fatigue and generalized weakness.  Physical exam reassuring with no overt source of infection.  No localizing neurodeficits.  Benign abdominal exam.  Unclear etiology for patient's fatigue.  Prolonged viral illness such as EBV/mono?  She did note she had some sugar in her urine from urgent care several weeks ago now having increased thirst and urination, uncontrolled hyperglycemia?  Will get basic screening labs, urine.  Does note she had cough, will get chest x-ray.  See ED course for further MDM and disposition.  Amount and/or Complexity of Data Reviewed Independent Historian:     Details: Family member notes patient slept close to 18 hours yesterday which is uncharacteristic for  her External Data Reviewed:     Details: No significant past medical history, but does have tobacco use Labs: ordered. Decision-making details documented in ED Course. Radiology: ordered. Decision-making details documented in ED Course. ECG/medicine tests: ordered.    Details: ECG on my independent interpretation normal sinus rhythm, normal intervals QTc slightly prolonged 518.  No ST segment changes that would indicate ischemia.  EKG similar to prior.  Risk Prescription drug management. Decision regarding hospitalization. Diagnosis or treatment significantly limited by social determinants of health. Risk Details: Poor health literacy          Final Clinical Impression(s) / ED Diagnoses Final diagnoses:  Hyperosmolar hyperglycemic state (HHS) [facility])    Rx / DC Orders ED Discharge Orders     None         Rolinda Climes, DO 08/03/23 1341

## 2023-08-03 NOTE — H&P (Addendum)
 Date: 08/03/2023               Patient Name:  [name] MRN: [id number]  DOB: [birth year] Age / Sex: 53 y.o., female   PCP: Pcp, No         Medical Service: Internal Medicine Teaching Service         Attending Physician: Dr. Sherol Dixie, MD      First Contact: Dr. Jayson Michael, MD Pager [phone]    Second Contact: Dr. Cleven Dallas, DO Pager [phone]         After Hours (After 5p/  First Contact Pager: [phone]  weekends / holidays): Second Contact Pager: [phone]   SUBJECTIVE   Chief Complaint: weight loss and fatigue  History of Present Illness:  [name] is a 53 y/o female with PMH of elevated blood pressures and abnormal uterine bleeding 2/2 fibroids presenting with 3 weeks of bothersome fatigue, polydipsia, polyuria, foamy urine, appetite loss, and weight loss for the past 3 weeks. She had cataract surgery about 1 month ago and noticed most of the symptoms above start after that. She denies any nausea, vomiting, abdominal pain, chest pain, dyspnea, dysuria, change in bowel habits, lower extremity edema, peripheral neuropathy, headaches, fever, or other associated symptoms.  Additionally she notes that she has been passing blood clots in her urine every 3 to 4 weeks without hematuria between.  She also notes for the past few months she has had difficulty having intercourse due to limited penetration but denies any vaginal pain, discharge, or definite vaginal bleeding/persistent vaginal bleeding.  ED Course: Presented as above afebrile, mildly hypertensive, and tachypneic but satting well on room air.  Initial labs showed blood glucose above 500 with an anion gap of 20, bicarb of 24, and beta hydroxybutyrate of 4.34.  VBG showed normal pH and serum osmolality is elevated at 316.  Consistent with HHS.  Past Medical History Elevated blood pressures Uterine fibroids  Meds:  No prescription or over-the-counter medications  Past Surgical  History Cataract surgery 2025  Social:  Lives at home with her husband and their goddaughter frequently visits.  Works as a Pension scheme manager.  Independent in ADLs and IADLs. PCP: Pcp, No Substances: Longstanding use of 1 cigarette a day and longtime near daily alcohol intake with increased in the last 2 years up to 2-4 shots of vodka a day.  No drug use.  Family History:  Brother with diabetes Mother with non-Hodgkin's lymphoma  Allergies: Allergies as of 08/03/2023   (No Known Allergies)    Review of Systems: A complete ROS was negative except as per HPI.   OBJECTIVE:   Physical Exam: Blood pressure 127/83, pulse 79, temperature 98.1 F (36.7 C), resp. rate (!) 21, SpO2 100%.  Constitutional: Thin and well-appearing female laying in bed. In no acute distress. HENT: Normocephalic, atraumatic,  Eyes: Sclera non-icteric, PERRL, EOM intact Cardio:Regular rate and rhythm. No murmurs. 2+ bilateral radial and dorsalis pedis  pulses. Pulm:Clear to auscultation bilaterally. Normal work of breathing on room air. Abdomen: Soft, non-tender, non-distended, positive bowel sounds. EXB:MWUXLKGM for extremity edema. Skin:Warm and dry. Neuro:Alert and oriented x3. No focal deficit noted. Psych:Pleasant mood and affect.  Labs: CBC    Component Value Date/Time   WBC 3.9 (L) 08/03/2023 1114   RBC 5.16 (H) 08/03/2023 1114   HGB 15.3 (H) 08/03/2023 1209   HCT 45.0 08/03/2023 1209   PLT 327 08/03/2023 1114   MCV 95.9 08/03/2023 1114   MCH 31.2 08/03/2023 1114  MCHC 32.5 08/03/2023 1114   RDW 13.1 08/03/2023 1114     CMP     Component Value Date/Time   NA 129 (L) 08/03/2023 1209   K 4.5 08/03/2023 1209   CL 86 (L) 08/03/2023 1114   CO2 24 08/03/2023 1114   GLUCOSE 516 (HH) 08/03/2023 1114   BUN 13 08/03/2023 1114   CREATININE 1.03 (H) 08/03/2023 1114   CALCIUM 10.9 (H) 08/03/2023 1114   PROT 8.3 (H) 08/03/2023 1114   ALBUMIN 4.7 08/03/2023 1114   AST 25 08/03/2023 1114   ALT  30 08/03/2023 1114   ALKPHOS 84 08/03/2023 1114   BILITOT 1.4 (H) 08/03/2023 1114   GFRNONAA >60 08/03/2023 1114    ASSESSMENT & PLAN:   Assessment & Plan by Problem: Principal Problem:   Hyperosmolar hyperglycemic state (HHS) (HCC)   [name] is a 53 y.o. female with pertinent PMH of elevated blood pressures and abnormal uterine bleeding 2/2 fibroids who presented with progressive fatigue, weight loss, polyuria, and polydipsia and is admitted for newly diagnosed diabetes and HHS.  HHS Newly diagnosed diabetes AGMA Overall admission labs consistent with elevated glucose with a high serum osmolality and high anion gap but normal bicarb and VBG without acidemia.  Patient appears well and is on Endo tool.  She does have a family history of diabetes in her brother but does not know which type.  Since this is new onset diabetes will rule out type I.  Transition off of Endo tool with a 2-hour overlap after effective serum modality less than 300, CBG less than 250, more than 30 mL urine output an hour. - Endo tool with IV insulin, every hour CBG, every 4 hour BMP - Strict ins and outs - Clear liquid diet, advance when off Endo tool - Type 1 diabetes panel - A1c  Elevated creatinine Creatinine 1.03 today and patient reports recent foamy urine.  We have not been able to get a urinalysis yet but with her new diagnosis of diabetes this is concerning for diabetic nephropathy.  Currently no active signs of nephrotic or nephritic syndrome.  Difficult to say if her creatinine is indicative of an AKI or CKD however I would think her creatinine would be lower based on her muscle mass so I do think that it is significantly abnormal. - Follow-up serial BMPs and UA  Hematuria versus abnormal uterine bleeding Uterine fibroids On chart review the patient has had similar symptoms in 2017 and was worked up by OB/GYN with an eventual benign endometrial biopsy.  She was recommended hysterectomy  versus uterine artery embolization but was going through financial aid paperwork at the time so she received Megace and was planning to follow-up to schedule her surgery.  This is likely recurrence of her symptoms from her fibroids however with her also having difficulty with sexual intercourse we will perform a vaginal exam here.  Is also difficult to tell if she is postmenopausal but it is likely that she is due to having 7 months without a menstrual period last year however her bleeding episodes do occur every 3-4 weeks. - Vaginal exam - Monitor CBC - UA - Outpatient referral to OB/GYN to resume care for uterine fibroids if nothing else is found  Elevated blood pressures Patient has had multiple elevated blood pressures at different acute visits and outpatient visits.  Her blood pressure was slightly elevated on arrival here however it has normalized but this is likely in the setting of hypovolemia.  She likely has  untreated hypertension and we will treat accordingly after treatment of her HHS.  Diet:  CLD VTE: DOAC Code: Full  Dispo: Admit patient to Observation with expected length of stay less than 2 midnights.  Signed: Cleven Dallas, DO Internal Medicine Resident, PGY-2 Please contact the on call pager at [phone] for any urgent or emergent needs. 2:46 PM 08/03/2023   Dr. Jayson Michael, MD Pager [phone]

## 2023-08-03 NOTE — ED Notes (Signed)
MD notified of glucose result

## 2023-08-04 ENCOUNTER — Other Ambulatory Visit: Payer: Self-pay | Admitting: Student

## 2023-08-04 DIAGNOSIS — Z794 Long term (current) use of insulin: Secondary | ICD-10-CM | POA: Diagnosis not present

## 2023-08-04 DIAGNOSIS — E1100 Type 2 diabetes mellitus with hyperosmolarity without nonketotic hyperglycemic-hyperosmolar coma (NKHHC): Secondary | ICD-10-CM | POA: Diagnosis not present

## 2023-08-04 LAB — GLUCOSE, CAPILLARY
Glucose-Capillary: 288 mg/dL — ABNORMAL HIGH (ref 70–99)
Glucose-Capillary: 326 mg/dL — ABNORMAL HIGH (ref 70–99)
Glucose-Capillary: 236 mg/dL — ABNORMAL HIGH (ref 70–99)
Glucose-Capillary: 403 mg/dL — ABNORMAL HIGH (ref 70–99)

## 2023-08-04 LAB — CBC WITH DIFFERENTIAL/PLATELET
Abs Immature Granulocytes: 0.01 10*3/uL (ref 0.00–0.07)
Basophils Absolute: 0 10*3/uL (ref 0.0–0.1)
Basophils Relative: 0 %
Eosinophils Absolute: 0.2 10*3/uL (ref 0.0–0.5)
Eosinophils Relative: 5 %
HCT: 37.3 % (ref 36.0–46.0)
Hemoglobin: 12.5 g/dL (ref 12.0–15.0)
Immature Granulocytes: 0 %
Lymphocytes Relative: 43 %
Lymphs Abs: 2 10*3/uL (ref 0.7–4.0)
MCH: 31.4 pg (ref 26.0–34.0)
MCHC: 33.5 g/dL (ref 30.0–36.0)
MCV: 93.7 fL (ref 80.0–100.0)
Monocytes Absolute: 0.4 10*3/uL (ref 0.1–1.0)
Monocytes Relative: 8 %
Neutro Abs: 2 10*3/uL (ref 1.7–7.7)
Neutrophils Relative %: 44 %
Platelets: 251 10*3/uL (ref 150–400)
RBC: 3.98 MIL/uL (ref 3.87–5.11)
RDW: 12.9 % (ref 11.5–15.5)
WBC: 4.6 10*3/uL (ref 4.0–10.5)
nRBC: 0 % (ref 0.0–0.2)

## 2023-08-04 LAB — BASIC METABOLIC PANEL WITH GFR
Anion gap: 11 (ref 5–15)
BUN: 9 mg/dL (ref 6–20)
CO2: 23 mmol/L (ref 22–32)
Calcium: 8.9 mg/dL (ref 8.9–10.3)
Chloride: 98 mmol/L (ref 98–111)
Creatinine, Ser: 0.63 mg/dL (ref 0.44–1.00)
GFR, Estimated: >60 mL/min (ref >60)
Glucose, Bld: 298 mg/dL — ABNORMAL HIGH (ref 70–99)
Potassium: 3.8 mmol/L (ref 3.5–5.1)
Sodium: 132 mmol/L — ABNORMAL LOW (ref 135–145)

## 2023-08-04 MED ORDER — INSULIN STARTER KIT- PEN NEEDLES (ENGLISH)
1.0000 | Freq: Once | Status: DC
  Filled 2023-08-04: qty 1

## 2023-08-04 MED ORDER — LANCETS MISC: 1.0000 | Freq: Three times a day (TID) | 0 refills | Status: AC

## 2023-08-04 MED ORDER — LIVING WELL WITH DIABETES BOOK
Freq: Once | Status: DC
  Filled 2023-08-04: qty 1

## 2023-08-04 MED ORDER — BLOOD GLUCOSE MONITORING SUPPL DEVI
1.0000 | Freq: Three times a day (TID) | 0 refills | Status: AC
Start: 2023-08-04 — End: ?

## 2023-08-04 MED ORDER — PEN NEEDLES 31G X 5 MM MISC: 1.0000 | Freq: Three times a day (TID) | 0 refills | Status: AC

## 2023-08-04 MED ORDER — BLOOD GLUCOSE TEST VI STRP: 1.0000 | ORAL_STRIP | Freq: Three times a day (TID) | 0 refills | Status: DC

## 2023-08-04 MED ORDER — INSULIN ASPART 100 UNIT/ML FLEXPEN: 4.0000 [IU] | PEN_INJECTOR | Freq: Three times a day (TID) | SUBCUTANEOUS | 0 refills | Status: DC

## 2023-08-04 MED ORDER — LANCET DEVICE MISC: 1.0000 | Freq: Three times a day (TID) | 0 refills | Status: AC

## 2023-08-04 MED ORDER — BASAGLAR KWIKPEN 100 UNIT/ML ~~LOC~~ SOPN: 15.0000 [IU] | PEN_INJECTOR | Freq: Every day | SUBCUTANEOUS | 1 refills | Status: DC

## 2023-08-04 NOTE — Discharge Instructions (Addendum)
 [name],  You were recently admitted to [facility] for elevated blood sugar and Hyperosmolar Hyperglycemic State. We treated you with insulin and you will be discharged with daily insulin as well as supplies to check your blood sugar at home.   Start taking Insulin Glargine 15 units daily Insulin Aspart 4 units with large meals  You should seek further medical care if you have any worsening or return of your symptoms. We will set you up with an appointment in our clinic, [facility]. Please call [phone] tomorrow to talk about scheduling this. We will have you meet our diabetes coordinator Abe Abed in the next few weeks as well based on her availability.  Sincerely, Cleven Dallas, DO

## 2023-08-04 NOTE — Inpatient Diabetes Management (Signed)
 Inpatient Diabetes Program Recommendations  AACE/ADA: New Consensus Statement on Inpatient Glycemic Control (2015)  Target Ranges:  Prepandial:   less than 140 mg/dL      Peak postprandial:   less than 180 mg/dL (1-2 hours)      Critically ill patients:  140 - 180 mg/dL   Lab Results  Component Value Date   GLUCAP 403 (H) 08/04/2023   HGBA1C 14.5 (H) 08/03/2023   Inpatient Diabetes Program Recommendations  AACE/ADA: New Consensus Statement on Inpatient Glycemic Control (2015)  Target Ranges:  Prepandial:   less than 140 mg/dL      Peak postprandial:   less than 180 mg/dL (1-2 hours)      Critically ill patients:  140 - 180 mg/dL   Lab Results  Component Value Date   GLUCAP 403 (H) 08/04/2023   HGBA1C 14.5 (H) 08/03/2023     Latest Reference Range & Units 08/04/23 06:52 08/04/23 11:40  Glucose-Capillary 70 - 99 mg/dL 161 (H) 096 (H)  (H): Data is abnormally high  Diabetes history: New onset DM Outpatient Diabetes medications: none Current orders for Inpatient glycemic control: Transitioned form IV insulin to Semglee 10 every day, Novolog 0-9 units TID and 0-5 units at bedtime  Inpatient Diabetes Recommendations:  Please consider Novolog 4 units TID with meals if she consumes at least 50%.  MD paged requesting DM/insulin education.  Diabetes Coordinator is not on campus over the weekend but available by pager from 8am to 5pm for questions or concerns. Chart reviewed.    Spoke with patient on the phone.  She has had weight loss, increased thirst and increased urination along with fatigue.  Reviewed patient's current A1c of 14.5%. Explained what a A1c is and what it measures. Also reviewed goal A1c with patient, importance of good glucose control @ home, and blood sugar goals.  Spoke with pt about new diagnosis. Discussed basic pathophysiology of DM Type 2, basic home care, basic diabetes diet nutrition principles, importance of checking CBGs and maintaining good CBG control to  prevent long-term and short-term complications. Reviewed signs and symptoms of hyperglycemia and hypoglycemia and how to treat hypoglycemia at home. Also reviewed blood sugar goals at home.  RNs to provide ongoing basic DM education at bedside with this patient. Have ordered educational booklet & insulin starter kit. Have also placed RD consult for DM diet education for this patient.   Her brother has DM2 and uses insulin with the insulin pen.  He also wear a Freestyle Libre CGM.  Patient has Medicaid and the Freestyle Jerrilyn Moras is covered by [facility] but does need a prior authorization.  She can speak to her PCP about this.  She will need a new patient appointment to establish with a PCP.    Educated patient on insulin pen use at home. Reviewed contents of insulin flexpen starter kit. Reviewed all steps of insulin pen including attachment of needle, 2-unit air shot, dialing up dose, giving injection, removing needle, disposal of sharps, storage of unused insulin, disposal of insulin etc. Also reviewed troubleshooting with insulin pen. MD to give patient Rxs for insulin pens and insulin pen needles. I texted her a You Tube video on how to administer insulin using the insulin pen.    Educated on The Plate Method, CHO's, portion control, eliminating caloric beverages, CBGs at home fasting and mid afternoon, F/U with PCP every 3 months, bring meter to PCP office, long and short term complications of uncontrolled BG, and importance of exercise.  She verbalizes  understanding and all questions answered.    Discharge Recommendations: Long acting recommendations: Insulin Glargine (LANTUS) Solostar Pen 15 units QD  Short acting recommendations:  Meal coverage ONLY Insulin aspart (NOVOLOG) FlexPen  4 units TID with meals   Supply/Referral recommendations: Glucometer Test strips Lancet device Lancets Pen needles - standard   Use Adult Diabetes Insulin Treatment Post Discharge order set.  Will continue to  follow while inpatient.  Thank you, Hays Lipschutz, MSN, CDCES Diabetes Coordinator Inpatient Diabetes Program [phone] (team pager from 8a-5p)

## 2023-08-04 NOTE — Discharge Summary (Addendum)
 Name: [name] MRN: [id number] DOB: 02-04-1971 53 y.o. PCP: Verma Gobble, NP  Date of Admission: 08/03/2023 11:02 AM Date of Discharge: 08/04/2023 Attending Physician: Dr. Broadus Canes  Discharge Diagnosis: Principal Problem:   Hyperosmolar hyperglycemic state (HHS) [facility]) Active Problems:   Abnormal uterine bleeding   Diabetes mellitus, new onset (HCC)   History of hypertension    Discharge Medications: Allergies as of 08/04/2023   No Known Allergies      Medication List     TAKE these medications    Basaglar  KwikPen 100 UNIT/ML Inject 15 Units into the skin daily. May substitute as needed per insurance.   Blood Glucose Monitoring Suppl Devi 1 each by Does not apply route 3 (three) times daily. May dispense any manufacturer covered by patient's insurance.   BLOOD GLUCOSE TEST STRIPS Strp 1 each by Does not apply route 3 (three) times daily. Use as directed to check blood sugar. May dispense any manufacturer covered by patient's insurance and fits patient's device.   Lancet Device Misc 1 each by Does not apply route 3 (three) times daily. May dispense any manufacturer covered by patient's insurance.   Lancets Misc 1 each by Does not apply route 3 (three) times daily. Use as directed to check blood sugar. May dispense any manufacturer covered by patient's insurance and fits patient's device.   Pen Needles 31G X 5 MM Misc 1 each by Does not apply route 3 (three) times daily. May dispense any manufacturer covered by patient's insurance.        Disposition and follow-up:   Ms.[name] was discharged from [facility] in Good condition.  At the hospital follow up visit please address:  1.  Follow-up:  a.  Review glucometer log and titrate insulin  as needed.  Please also follow-up urinalysis and urine microalbumin as well as type 1 diabetes antibodies.    b.  Repeat blood pressure as she has had high blood pressure in the  past but blood pressures were predominantly normotensive during admission.   c.  Discuss referral to OB/GYN for further evaluation and management of abnormal uterine bleeding likely due to uterine fibroids.  Follow-up Appointments:  Follow-up Information     Jaime Dallas, DO. Schedule an appointment as soon as possible for a visit.   Specialty: Internal Medicine Contact information: [address] [place] [address] [phone]         Plyler, [name], RD. Schedule an appointment as soon as possible for a visit.   Specialty: Buyer, retail information: [address] [place] [address] [phone]                 Hospital Course by problem list: [name] is a 53 y/o female with history of hypertension not on medication and abnormal uterine bleeding due to uterine fibroids who presented with progressive fatigue, polydipsia, polyuria, and weight loss over 3 weeks and was admitted with HHS and new onset uncontrolled diabetes with an A1c of 14.5.  She was placed on Endo tool in the ED and was transitioned off after about 12 hours with resolution of her HHS.  She also had a an elevated anion gap and elevated beta hydroxybutyrate but no acidosis or acidemia with normal bicarb and normal pH on VBG.  Her anion gap closed x 2 before transition as well.  Due to new onset diabetes and unclear cause we did send labs to rule out type 1 diabetes.  On admission she had a  prerenal AKI with creatinine of 1.03 that improved to 0.6 after IV fluids.  She also had complaints of foamy urine but at the time of discharge her UA and urine microalbumin are pending.  She did still have elevated blood glucoses in the 200s and 300s on the day of discharge however her insulin  dose was increased and mealtime insulin  was added with plan to close follow-up in our clinic for further titration.  She was given prescriptions for diabetes supplies, information about diabetes and the use of  insulin , and discussed starting insulin  with the diabetes coordinator over the phone.   Discharge Subjective: Patient is doing very well this morning without any new or worsening complaints.  She has been eating and drinking well without any issues.  She has talked to the diabetes coordinator and is confident in her ability to use insulin  and monitor her blood sugar.  She will call our clinic to schedule follow-up with a provider and our diabetes coordinator.  Discharge Exam:   BP 105/67 (BP Location: Left Arm)   Pulse 86   Temp 98.2 F (36.8 C) (Oral)   Resp 18   Ht 5\' 6"  (1.676 m)   Wt 60.4 kg   SpO2 100%   BMI 21.50 kg/m  Constitutional: well-appearing female sitting in bed, in no acute distress Cardiovascular: regular rate and rhythm Pulmonary/Chest: normal work of breathing on room air  Pertinent Labs, Studies, and Procedures:     Latest Ref Rng & Units 08/04/2023    1:01 AM 08/03/2023   12:09 PM 08/03/2023   11:14 AM  CBC  WBC 4.0 - 10.5 K/uL 4.6   3.9   Hemoglobin 12.0 - 15.0 g/dL 78.4  69.6  29.5   Hematocrit 36.0 - 46.0 % 37.3  45.0  49.5   Platelets 150 - 400 K/uL 251   327        Latest Ref Rng & Units 08/04/2023    1:01 AM 08/03/2023    8:33 PM 08/03/2023    2:09 PM  CMP  Glucose 70 - 99 mg/dL 284  132  440   BUN 6 - 20 mg/dL 9  10  14    Creatinine 0.44 - 1.00 mg/dL 1.02  7.25  3.66   Sodium 135 - 145 mmol/L 132  133  132   Potassium 3.5 - 5.1 mmol/L 3.8  3.2  4.4   Chloride 98 - 111 mmol/L 98  101  92   CO2 22 - 32 mmol/L 23  23  22    Calcium 8.9 - 10.3 mg/dL 8.9  9.3  44.0     DG Chest 2 View Result Date: 08/03/2023 CLINICAL DATA:  Cough. EXAM: CHEST - 2 VIEW COMPARISON:  12/25/2020 FINDINGS: The lungs are clear without focal pneumonia, edema, pneumothorax or pleural effusion. The cardiopericardial silhouette is within normal limits for size. No acute bony abnormality. Telemetry leads overlie the chest. IMPRESSION: No active cardiopulmonary disease.  Electronically Signed   By: Donnal Fusi M.D.   On: 08/03/2023 11:53     Discharge Instructions: [name],  You were recently admitted to [facility] for elevated blood sugar and Hyperosmolar Hyperglycemic State. We treated you with insulin  and you will be discharged with daily insulin  as well as supplies to check your blood sugar at home.   Start taking Insulin  Glargine 15 units daily Insulin  Aspart 4 units with large meals  You should seek further medical care if you have any worsening or return of your  symptoms. We will set you up with an appointment in our clinic, [facility]. Please call [phone] tomorrow to talk about scheduling this. We will have you meet our diabetes coordinator Abe Abed in the next few weeks as well based on her availability.  Sincerely, Jaime Dallas, DO  Signed: Sherol Dixie, MD Internal Medicine Resident, PGY-2 Pager# [phone] 08/11/2023, 2:23 PM   Please contact the on call pager after 5 pm and on weekends at [phone].

## 2023-08-05 ENCOUNTER — Other Ambulatory Visit: Payer: Self-pay | Admitting: Student

## 2023-08-05 DIAGNOSIS — E119 Type 2 diabetes mellitus without complications: Secondary | ICD-10-CM

## 2023-08-05 LAB — ANTI-ISLET CELL ANTIBODY: Pancreatic Islet Cell Antibody: NEGATIVE

## 2023-08-05 LAB — MICROALBUMIN / CREATININE URINE RATIO
Creatinine, Urine: 264.5 mg/dL
Microalb Creat Ratio: 9 mg/g{creat} (ref 0–29)
Microalb, Ur: 23.5 ug/mL — ABNORMAL HIGH

## 2023-08-05 MED ORDER — INSULIN LISPRO (1 UNIT DIAL) 100 UNIT/ML (KWIKPEN): 4.0000 [IU] | PEN_INJECTOR | Freq: Three times a day (TID) | SUBCUTANEOUS | 2 refills | Status: AC

## 2023-08-07 ENCOUNTER — Telehealth: Payer: Self-pay

## 2023-08-07 ENCOUNTER — Encounter: Payer: Self-pay | Admitting: Nurse Practitioner

## 2023-08-07 ENCOUNTER — Ambulatory Visit (INDEPENDENT_AMBULATORY_CARE_PROVIDER_SITE_OTHER): Payer: Self-pay | Admitting: Nurse Practitioner

## 2023-08-07 VITALS — BP 110/68 | HR 90 | Temp 97.8°F | Resp 21 | Ht 66.0 in | Wt 138.0 lb

## 2023-08-07 DIAGNOSIS — N939 Abnormal uterine and vaginal bleeding, unspecified: Secondary | ICD-10-CM

## 2023-08-07 DIAGNOSIS — Z720 Tobacco use: Secondary | ICD-10-CM | POA: Diagnosis not present

## 2023-08-07 DIAGNOSIS — J302 Other seasonal allergic rhinitis: Secondary | ICD-10-CM

## 2023-08-07 DIAGNOSIS — E119 Type 2 diabetes mellitus without complications: Secondary | ICD-10-CM | POA: Diagnosis not present

## 2023-08-07 LAB — GLUTAMIC ACID DECARBOXYLASE AUTO ABS: Glutamic Acid Decarb Ab: <5 U/mL (ref 0.0–5.0)

## 2023-08-07 MED ORDER — DEXCOM G6 TRANSMITTER MISC: 6 refills | Status: DC

## 2023-08-07 MED ORDER — DEXCOM G6 SENSOR MISC: 0 refills | Status: DC

## 2023-08-07 MED ORDER — DEXCOM G6 RECEIVER DEVI: 6 refills | Status: DC

## 2023-08-07 NOTE — Telephone Encounter (Signed)
 Prior Authorization for patient (NovoLOG FlexPen 100UNIT/ML pen-injectors) came through on cover my meds was submitted with hospital discharge notes and labs awaiting approval or denial.  [id number]

## 2023-08-07 NOTE — Patient Instructions (Addendum)
 Increase lantus to 17 units for 3 days  If fasting blood sugar still over 150 increase to 20 units.  After 3 days if still over 150 consistently increase to 23 units.   To take Loratadine or cetrizine (generic for Claritin or zyrtec) 10 mg by mouth daily for allergies.  Do not blow your nose hard Use saline/netipot daily

## 2023-08-07 NOTE — Progress Notes (Signed)
 Careteam: Patient Care Team: Sharon Seller, NP as PCP - General (Geriatric Medicine)  PLACE OF SERVICE:  [facility]  Advanced Directive information Does Patient Have a Medical Advance Directive?: No, Would patient like information on creating a medical advance directive?: No - Patient declined  No Known Allergies  Chief Complaint  Patient presents with   New Patient (Initial Visit)    Has concerns about her Insulin numbers. Discuss the need for Cervical cancer screening, foot exam, Colonoscopy, mammogram    Discussed the use of AI scribe software for clinical note transcription with the patient, who gave verbal consent to proceed.  History of Present Illness   [name] is a 53 year old female with diabetes who presents for an established care appointment following a recent hospitalization.  She was hospitalized on August 03, 2023, due to uncontrolled diabetes, HHNK, which she was unaware of prior to the hospitalization. Since the hospital stay, she experienced high blood sugar levels, with the lowest recorded at 230 mg/dL and others around 161 mg/dL. No episodes of hypoglycemia were noted. She is currently on glargine 15 units daily and Humalog 4 units with meals, but her blood sugar remains elevated. She checks her blood sugar more than four times a day and is concerned about the high readings. She has experienced weight loss over the past three weeks, attributed to high blood sugar levels.  She has a history of uterine fibroids and reports ongoing abnormal uterine bleeding. She has not seen a gynecologist for this issue.  She experiences congestion due to seasonal allergies, which are present year-round. She has not tried medications like Zyrtec or Claritin for relief.  She smokes occasionally, primarily when drinking alcohol, and has a history of smoking for about 20 years. She consumes alcohol approximately one drink every other day and has a history of  marijuana use but no current drug use.  She reports occasional numbness or tingling in her legs based on how she is positioned, but it is not continuous.  She has a family history of cancer, with her mother having had non-Hodgkin's lymphoma and her father having had throat cancer and another unspecified type. Her father also had diabetes, high blood pressure, and kidney issues requiring dialysis. Her brother has diabetes, and another brother has chronic kidney stones.      Review of Systems:  Review of Systems  Constitutional:  Negative for chills, fever and weight loss.  HENT:  Negative for tinnitus.   Respiratory:  Negative for cough, sputum production and shortness of breath.   Cardiovascular:  Negative for chest pain, palpitations and leg swelling.  Gastrointestinal:  Negative for abdominal pain, constipation, diarrhea and heartburn.  Genitourinary:  Negative for dysuria, frequency and urgency.  Musculoskeletal:  Negative for back pain, falls, joint pain and myalgias.  Skin: Negative.   Neurological:  Negative for dizziness and headaches.  Psychiatric/Behavioral:  Negative for depression and memory loss. The patient does not have insomnia.     Past Medical History:  Diagnosis Date   Tobacco abuse    History reviewed. No pertinent surgical history. Social History:   reports that she has been smoking cigarettes. She has never used smokeless tobacco. She reports current alcohol use. She reports current drug use. Drug: Cocaine.  Family History  Problem Relation Age of Onset   Diabetes Brother     Medications: Patient's Medications  New Prescriptions   No medications on file  Previous Medications   BLOOD GLUCOSE MONITORING SUPPL DEVI  1 each by Does not apply route 3 (three) times daily. May dispense any manufacturer covered by patient's insurance.   GLUCOSE BLOOD (BLOOD GLUCOSE TEST STRIPS) STRP    1 each by Does not apply route 3 (three) times daily. Use as directed to  check blood sugar. May dispense any manufacturer covered by patient's insurance and fits patient's device.   INSULIN GLARGINE (BASAGLAR KWIKPEN) 100 UNIT/ML    Inject 15 Units into the skin daily. May substitute as needed per insurance.   INSULIN LISPRO (HUMALOG KWIKPEN) 100 UNIT/ML KWIKPEN    Inject 4 Units into the skin with breakfast, with lunch, and with evening meal.   INSULIN PEN NEEDLE (PEN NEEDLES) 31G X 5 MM MISC    1 each by Does not apply route 3 (three) times daily. May dispense any manufacturer covered by patient's insurance.   LANCET DEVICE MISC    1 each by Does not apply route 3 (three) times daily. May dispense any manufacturer covered by patient's insurance.   LANCETS MISC    1 each by Does not apply route 3 (three) times daily. Use as directed to check blood sugar. May dispense any manufacturer covered by patient's insurance and fits patient's device.  Modified Medications   No medications on file  Discontinued Medications   No medications on file    Physical Exam:  Vitals:   08/07/23 0813  BP: 110/68  Pulse: 90  Resp: (!) 21  Temp: 97.8 F (36.6 C)  SpO2: 99%  Weight: 138 lb (62.6 kg)  Height: 5\' 6"  (1.676 m)   Body mass index is 22.27 kg/m. Wt Readings from Last 3 Encounters:  08/07/23 138 lb (62.6 kg)  08/03/23 133 lb 3.2 oz (60.4 kg)  06/30/15 142 lb 11.2 oz (64.7 kg)    Physical Exam Constitutional:      General: She is not in acute distress.    Appearance: She is well-developed. She is not diaphoretic.  HENT:     Head: Normocephalic and atraumatic.     Mouth/Throat:     Pharynx: No oropharyngeal exudate.  Eyes:     Conjunctiva/sclera: Conjunctivae normal.     Pupils: Pupils are equal, round, and reactive to light.  Cardiovascular:     Rate and Rhythm: Normal rate and regular rhythm.     Heart sounds: Normal heart sounds.  Pulmonary:     Effort: Pulmonary effort is normal.     Breath sounds: Normal breath sounds.  Abdominal:     General:  Bowel sounds are normal.     Palpations: Abdomen is soft.  Musculoskeletal:     Cervical back: Normal range of motion and neck supple.     Right lower leg: No edema.     Left lower leg: No edema.  Skin:    General: Skin is warm and dry.  Neurological:     Mental Status: She is alert.  Psychiatric:        Mood and Affect: Mood normal.     Labs reviewed: Basic Metabolic Panel: Recent Labs    08/03/23 1409 08/03/23 2033 08/04/23 0101  NA 132* 133* 132*  K 4.4 3.2* 3.8  CL 92* 101 98  CO2 22 23 23   GLUCOSE 417* 310* 298*  BUN 14 10 9   CREATININE 0.90 0.70 0.63  CALCIUM 10.2 9.3 8.9   Liver Function Tests: Recent Labs    08/03/23 1114  AST 25  ALT 30  ALKPHOS 84  BILITOT 1.4*  PROT 8.3*  ALBUMIN 4.7   No results for input(s): "LIPASE", "AMYLASE" in the last 8760 hours. No results for input(s): "AMMONIA" in the last 8760 hours. CBC: Recent Labs    08/03/23 1114 08/03/23 1209 08/04/23 0101  WBC 3.9*  --  4.6  NEUTROABS  --   --  2.0  HGB 16.1* 15.3* 12.5  HCT 49.5* 45.0 37.3  MCV 95.9  --  93.7  PLT 327  --  251   Lipid Panel: No results for input(s): "CHOL", "HDL", "LDLCALC", "TRIG", "CHOLHDL", "LDLDIRECT" in the last 8760 hours. TSH: No results for input(s): "TSH" in the last 8760 hours. A1C: Lab Results  Component Value Date   HGBA1C 14.5 (H) 08/03/2023     Assessment/Plan .1. Type 2 diabetes mellitus without complication, without long-term current use of insulin (HCC) (Primary) Blood glucose levels remain elevated despite insulin regimen. No hypoglycemic episodes. She is motivated to manage diabetes and understands potential complications. - Increase Lantus to 17 units at night for 3 days, then to 20 units if fasting blood sugar remains over 150. After 3 additional days increase to 23 units. Goal blood sugar is 70-100 fasting however will need to slowly get there.  - Continue Humalog 4 units with meals. Has follow up with nutritionist today Has  been educated on hypoglycemia - Continuous Glucose Sensor (DEXCOM G6 SENSOR) MISC; Use as directed  Dispense: 1 each; Refill: 0 - Continuous Glucose Receiver (DEXCOM G6 RECEIVER) DEVI; Use as directed  Dispense: 1 each; Refill: 6 - Continuous Glucose Transmitter (DEXCOM G6 TRANSMITTER) MISC; Use as directed  Dispense: 1 each; Refill: 6 - Ambulatory referral to Endocrinology  2. Abnormal uterine bleeding - Ambulatory referral to Gynecology for further evaluation   3. Seasonal allergies -To take Loratadine or cetrizine (generic for Claritin or zyrtec) 10 mg by mouth daily for allergies.  Avoid forcefully blowing nose Use nettipot or saline rinse daily Saline spray as needed  4. Tobacco use Cessation discussed   Return in about 4 weeks (around 09/04/2023) for blood sugar .  Kanija Remmel K. Denney Fisherman [facility] [phone]

## 2023-08-08 NOTE — Telephone Encounter (Signed)
 [name] (Key: [id number]) PA Case ID #: [id number] Rx #: [id number] Need Help? 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 Claim reject by Non-Formulary..  Per patients insurance, you must first try two preferred drugs used to treat your condition or there is a reason they cannot be used.  We cannot approve the request because you have tired Humalog, but you need to try one more, insulin lispro.

## 2023-08-11 DIAGNOSIS — E119 Type 2 diabetes mellitus without complications: Secondary | ICD-10-CM

## 2023-08-11 DIAGNOSIS — Z8679 Personal history of other diseases of the circulatory system: Secondary | ICD-10-CM

## 2023-08-15 ENCOUNTER — Other Ambulatory Visit: Payer: Self-pay | Admitting: Nurse Practitioner

## 2023-08-15 DIAGNOSIS — E119 Type 2 diabetes mellitus without complications: Secondary | ICD-10-CM

## 2023-08-15 LAB — IA-2 AUTOANTIBODIES: IA-2 Autoantibodies: <7.5 U/mL

## 2023-08-16 LAB — ZNT8 ANTIBODIES: ZNT8 Antibodies: 20 U/mL — ABNORMAL HIGH

## 2023-08-19 LAB — INSULIN ANTIBODIES, BLOOD: Insulin Antibodies, Human: <5 uU/mL

## 2023-08-23 ENCOUNTER — Encounter: Admitting: Nurse Practitioner

## 2023-08-23 NOTE — Progress Notes (Signed)
 This encounter was created in error - please disregard.

## 2023-08-28 ENCOUNTER — Telehealth: Payer: Self-pay

## 2023-08-28 NOTE — Telephone Encounter (Signed)
   Left detailed voicemail informing pharmacy of outcome

## 2023-08-28 NOTE — Telephone Encounter (Signed)
 Incoming fax received from patients pharmacy to initiate a prior authorization for                   .  PA initiated through covermymeds. Key: [id number]  Awaiting reply from the insurance company which will be determined in 48-72 hours.

## 2023-09-02 ENCOUNTER — Encounter: Payer: Self-pay | Admitting: Nurse Practitioner

## 2023-09-02 ENCOUNTER — Ambulatory Visit: Admitting: Nurse Practitioner

## 2023-09-02 VITALS — BP 140/86 | HR 73 | Temp 97.7°F | Ht 66.0 in | Wt 147.4 lb

## 2023-09-02 DIAGNOSIS — Z1211 Encounter for screening for malignant neoplasm of colon: Secondary | ICD-10-CM

## 2023-09-02 DIAGNOSIS — Z1212 Encounter for screening for malignant neoplasm of rectum: Secondary | ICD-10-CM

## 2023-09-02 DIAGNOSIS — Z794 Long term (current) use of insulin: Secondary | ICD-10-CM

## 2023-09-02 DIAGNOSIS — R600 Localized edema: Secondary | ICD-10-CM

## 2023-09-02 DIAGNOSIS — E1141 Type 2 diabetes mellitus with diabetic mononeuropathy: Secondary | ICD-10-CM | POA: Diagnosis not present

## 2023-09-02 DIAGNOSIS — N939 Abnormal uterine and vaginal bleeding, unspecified: Secondary | ICD-10-CM | POA: Diagnosis not present

## 2023-09-02 NOTE — Patient Instructions (Addendum)
 Continue to take insuline (long and short acting) how you are taking it  Reschedule dietitian visit.   Call and schedule diabetic eye exam.

## 2023-09-02 NOTE — Progress Notes (Signed)
 Careteam: Patient Care Team: Verma Gobble, NP as PCP - General (Geriatric Medicine)  PLACE OF SERVICE:  [facility]  Advanced Directive information    No Known Allergies  Chief Complaint  Patient presents with   Follow-up    4 week follow up  diabetes  notice that  she doesn't have as much insulin  , work on her diet , notice discolor, having swelling in feet, having some tingle. Elevate when swollen .     HPI:  Discussed the use of AI scribe software for clinical note transcription with the patient, who gave verbal consent to proceed.  History of Present Illness [name] is a 53 year old female with type 2 diabetes who presents for a diabetes follow-up.  She manages her type 2 diabetes with Lantus , 10 units every evening, after experiencing hypoglycemia at a higher dose of 17 units. She uses Humalog  2 to 3 times daily, adjusting the dose between 6 and 7 units based on her blood glucose levels. She monitors her glucose with a Dexcom device, though she recently had issues with data retrieval after changing the device. Her fasting glucose levels are around 115 mg/dL, and postprandial levels can rise to 165 mg/dL.  She experienced a recent hypoglycemic episode with a blood sugar level of 64 mg/dL, accompanied by shaking, feeling hot, and sweating. She has not met with a dietitian due to work schedule conflicts but is attempting dietary changes by increasing protein intake from beans and reducing red meat.  She works in [employer], often finishing late, and has reduced smoking to two cigarettes per week. She was hospitalized a month ago for uncontrolled blood glucose levels, has not made appt with endocrinologist yet  Also has not had any further abnormal uterine bleeding. Has been given the number to schedule appt with GYN  She experiences swelling in her ankles, worsening in the evening, and numbness and tingling in her legs. She underwent cataract surgery in  April and is due for a diabetic eye exam, mammogram, and colonoscopy.  She has mammogram scheduled.    Review of Systems:  Review of Systems  Constitutional:  Negative for chills, fever and weight loss.  HENT:  Negative for tinnitus.   Respiratory:  Negative for cough, sputum production and shortness of breath.   Cardiovascular:  Negative for chest pain, palpitations and leg swelling.  Gastrointestinal:  Negative for abdominal pain, constipation, diarrhea and heartburn.  Genitourinary:  Negative for dysuria, frequency and urgency.  Musculoskeletal:  Negative for back pain, falls, joint pain and myalgias.  Skin: Negative.   Neurological:  Negative for dizziness and headaches.  Psychiatric/Behavioral:  Negative for depression and memory loss. The patient does not have insomnia.     Past Medical History:  Diagnosis Date   Allergy    Tobacco abuse    Uterine fibroid    History reviewed. No pertinent surgical history. Social History:   reports that she has been smoking cigarettes. She started smoking about 20 years ago. She has a 2.6 pack-year smoking history. She has never used smokeless tobacco. She reports current alcohol use of about 4.0 standard drinks of alcohol per week. She reports that she does not use drugs.  Family History  Problem Relation Age of Onset   Cancer Mother 36       nonhodgkin's lymphoma   Kidney disease Father    Hyperlipidemia Father    Heart disease Father    Diabetes Father    Cancer Father  throat and unknown cancer   Diabetes Brother    Kidney disease Brother     Medications: Patient's Medications  New Prescriptions   No medications on file  Previous Medications   BLOOD GLUCOSE MONITORING SUPPL DEVI    1 each by Does not apply route 3 (three) times daily. May dispense any manufacturer covered by patient's insurance.   CONTINUOUS GLUCOSE RECEIVER (DEXCOM G6 RECEIVER) DEVI    Use as directed   CONTINUOUS GLUCOSE SENSOR (DEXCOM G6 SENSOR)  MISC    USE AS DIRECTED   CONTINUOUS GLUCOSE TRANSMITTER (DEXCOM G6 TRANSMITTER) MISC    Use as directed   GLUCOSE BLOOD (BLOOD GLUCOSE TEST STRIPS) STRP    1 each by Does not apply route 3 (three) times daily. Use as directed to check blood sugar. May dispense any manufacturer covered by patient's insurance and fits patient's device.   INSULIN  GLARGINE (LANTUS ) 100 UNIT/ML INJECTION    Inject 10 Units into the skin at bedtime.   INSULIN  LISPRO (HUMALOG  KWIKPEN) 100 UNIT/ML KWIKPEN    Inject 4 Units into the skin with breakfast, with lunch, and with evening meal.   INSULIN  PEN NEEDLE (PEN NEEDLES) 31G X 5 MM MISC    1 each by Does not apply route 3 (three) times daily. May dispense any manufacturer covered by patient's insurance.   LANCET DEVICE MISC    1 each by Does not apply route 3 (three) times daily. May dispense any manufacturer covered by patient's insurance.   LANCETS MISC    1 each by Does not apply route 3 (three) times daily. Use as directed to check blood sugar. May dispense any manufacturer covered by patient's insurance and fits patient's device.  Modified Medications   No medications on file  Discontinued Medications   INSULIN  GLARGINE (BASAGLAR  KWIKPEN) 100 UNIT/ML    Inject 15 Units into the skin daily. May substitute as needed per insurance.    Physical Exam:  Vitals:   09/02/23 1513  BP: (!) 140/86  Pulse: 73  Temp: 97.7 F (36.5 C)  SpO2: 93%  Weight: 147 lb 6.4 oz (66.9 kg)  Height: 5\' 6"  (1.676 m)   Body mass index is 23.79 kg/m. Wt Readings from Last 3 Encounters:  09/02/23 147 lb 6.4 oz (66.9 kg)  08/07/23 138 lb (62.6 kg)  08/03/23 133 lb 3.2 oz (60.4 kg)    Physical Exam Constitutional:      General: She is not in acute distress.    Appearance: She is well-developed. She is not diaphoretic.  HENT:     Head: Normocephalic and atraumatic.     Mouth/Throat:     Pharynx: No oropharyngeal exudate.  Eyes:     Conjunctiva/sclera: Conjunctivae normal.      Pupils: Pupils are equal, round, and reactive to light.  Cardiovascular:     Rate and Rhythm: Normal rate and regular rhythm.     Heart sounds: Normal heart sounds.  Pulmonary:     Effort: Pulmonary effort is normal.     Breath sounds: Normal breath sounds.  Abdominal:     General: Bowel sounds are normal.     Palpations: Abdomen is soft.  Musculoskeletal:     Cervical back: Normal range of motion and neck supple.     Right lower leg: No edema.     Left lower leg: No edema.  Skin:    General: Skin is warm and dry.  Neurological:     Mental Status: She is alert and oriented  to person, place, and time.     Motor: No weakness.     Gait: Gait normal.  Psychiatric:        Mood and Affect: Mood normal.     Labs reviewed: Basic Metabolic Panel: Recent Labs    08/03/23 1409 08/03/23 2033 08/04/23 0101  NA 132* 133* 132*  K 4.4 3.2* 3.8  CL 92* 101 98  CO2 22 23 23   GLUCOSE 417* 310* 298*  BUN 14 10 9   CREATININE 0.90 0.70 0.63  CALCIUM 10.2 9.3 8.9   Liver Function Tests: Recent Labs    08/03/23 1114  AST 25  ALT 30  ALKPHOS 84  BILITOT 1.4*  PROT 8.3*  ALBUMIN 4.7   No results for input(s): "LIPASE", "AMYLASE" in the last 8760 hours. No results for input(s): "AMMONIA" in the last 8760 hours. CBC: Recent Labs    08/03/23 1114 08/03/23 1209 08/04/23 0101  WBC 3.9*  --  4.6  NEUTROABS  --   --  2.0  HGB 16.1* 15.3* 12.5  HCT 49.5* 45.0 37.3  MCV 95.9  --  93.7  PLT 327  --  251   Lipid Panel: No results for input(s): "CHOL", "HDL", "LDLCALC", "TRIG", "CHOLHDL", "LDLDIRECT" in the last 8760 hours. TSH: No results for input(s): "TSH" in the last 8760 hours. A1C: Lab Results  Component Value Date   HGBA1C 14.5 (H) 08/03/2023     Assessment/Plan  Type 2 diabetes mellitus with diabetic mononeuropathy, with long-term current use of insulin  (HCC) Assessment & Plan: She has adjusted her lantus  to 10 units daily at bedtime fasting blood sugars per  report are around 115 Mealtime blood sugar peek around 160-180 and she has been taking humalog  6-7 units at that time.  She has self educated herself through research and plans to meet with dietitian, will also consult pharmacist since she is new onset.  Appt made for endocrine   Orders: -     AMB Referral VBCI Care Management -     Pneumococcal conjugate vaccine 20-valent  Abnormal uterine bleeding Assessment & Plan: Number given to call and make appt with GYN   Bilateral leg edema Assessment & Plan: -encouraged to elevate legs above level of heart as tolerates, low sodium diet, compression hose as tolerates (on in am, off in pm)   Encounter for colorectal cancer screening -     Cologuard     Return in about 2 months (around 11/02/2023) for routine follow up, labs at time of visit.:   Young Brim K. Denney Fisherman [facility] [phone]

## 2023-09-02 NOTE — Assessment & Plan Note (Signed)
 Number given to call and make appt with GYN

## 2023-09-02 NOTE — Assessment & Plan Note (Signed)
-  encouraged to elevate legs above level of heart as tolerates, low sodium diet, compression hose as tolerates (on in am, off in pm)

## 2023-09-02 NOTE — Assessment & Plan Note (Signed)
 She has adjusted her lantus  to 10 units daily at bedtime fasting blood sugars per report are around 115 Mealtime blood sugar peek around 160-180 and she has been taking humalog  6-7 units at that time.  She has self educated herself through research and plans to meet with dietitian, will also consult pharmacist since she is new onset.  Appt made for endocrine

## 2023-09-03 ENCOUNTER — Ambulatory Visit: Payer: Self-pay | Admitting: "Endocrinology

## 2023-09-07 ENCOUNTER — Other Ambulatory Visit: Payer: Self-pay | Admitting: Nurse Practitioner

## 2023-09-07 DIAGNOSIS — E119 Type 2 diabetes mellitus without complications: Secondary | ICD-10-CM

## 2023-09-11 ENCOUNTER — Telehealth: Payer: Self-pay

## 2023-09-11 NOTE — Telephone Encounter (Signed)
 Patient stopped by the office to question why we have not sent rx for Dexcom Sensor to [facility]. [name] at the front desk spoke with patient and told her that we did send rx over on 09/09/23. [name] asked that I double check as patient was leaving to go to [place].   I called patient and assured her that we sent rx over. Patient states she had to discard 2 of the sensors trying to figure out how to apply them correctly and that is why she needed another prescription. I told patient refills are on file however the insurance will not pay if its too soon. Patient asked that I call [facility] to verify.  I called [facility] and it was confirmed rx received, however insurance will not pay for coverage until 09/20/23 and if patient would like to pay out of pocket it's 156 dollars for 1 sensor.  I circled back and informed patient and she decided she will use her other blood sugar machine in the interim until insurance will cover sensor again on the 30th

## 2023-09-13 ENCOUNTER — Telehealth: Payer: Self-pay | Admitting: Pharmacist

## 2023-09-13 DIAGNOSIS — E1141 Type 2 diabetes mellitus with diabetic mononeuropathy: Secondary | ICD-10-CM

## 2023-09-13 NOTE — Progress Notes (Signed)
   09/13/2023  Patient ID: [name], female   DOB: 31-Aug-1970, 53 y.o.   MRN: [id number]  Patient was called per referral for diabetes management as the Patient was newly diagnosed with diabetes.  From chart review, her HgA1c was 14.5%.  She was started on Basal/Bolus therapy with Lantus  and Humalog . (There was some documentation of hypoglycemia and that the Patient self titrated her insulin  dose.)  She was also started on Dexcom but there seems to be an issue with how she applies the sensors.  She was started on Dexcom G6 however, she should probably be switched to the G7 as it is the newest product. It is not clear if the Patient has a regular blood glucose meter.  Not on statin therapy    Plan: Call Patient back in 2-3 business days.  Geronimo Krabbe, PharmD, BCACP Clinical Pharmacist [phone]

## 2023-09-18 ENCOUNTER — Telehealth: Payer: Self-pay | Admitting: Pharmacist

## 2023-09-18 DIAGNOSIS — Z794 Long term (current) use of insulin: Secondary | ICD-10-CM

## 2023-09-18 NOTE — Progress Notes (Unsigned)
 09/18/2023 Name: [name] MRN: [id number] DOB: February 04, 1971  Chief Complaint  Patient presents with   Medication Management    Diabetes     [name] is a 53 y.o. year old female who presented for a telephone visit.   They were referred to the pharmacist by their PCP for assistance in managing diabetes.    Subjective: [name] is a 53 year old female who was recently diagnosed with type 2 diabetes, her HgA1c was >14%.  Care Team: Primary Care Provider: Verma Gobble, NP 12/11/2023 Endocrinology Dr. Vertell Gory 12/16/23  Medication Access/Adherence  Current Pharmacy:  [id number]le, [place] - [address] AT [facility] OF [address] [place] [address] Phone: [phone] Fax: [phone]  [id number] - [place] ([place]), [place] - [address]  ([place]) [place] [address] Phone: [phone] Fax: [phone]   Patient reports affordability concerns with their medications: No  Patient reports access/transportation concerns to their pharmacy: No  Patient reports adherence concerns with their medications:  No      Diabetes:  Current medications:  Lantus  10 units daily Humalog  6-7 units daily  Current glucose readings: Uses Accu-Chec and says her average for yesterday was 126 mg/dl  She was prescribed the Dexcom 6 senor but was trying to use the Dexcom 7 software/app on her phone.  Patient said she reached out to [employer] and they will be sending her 2 new sensors since she damaged two trying to link them to wrong app.  Patient denies hypoglycemic s/sx including  dizziness, shakiness, sweating. Patient denies hyperglycemic symptoms including  polyuria, polydipsia, polyphagia, nocturia, neuropathy, blurred vision.  Current meal patterns:  - Breakfast: Oatmeal with rasions or yogurt - Lunch  baked chicken, ribs, vegetables. - Supper baked meats,brown  rice, two vegetables. - Snacks cucumbers - Drinks water Has decreased drinking sodas.  Has decreased the number of potatoes and rice Brown rice, vegetables, snacks on cucumbers  Current physical activity: walks daily outside in the morning and in the afternoon.     Hyperlipidemia/ASCVD Risk Reduction  Current lipid lowering medications:  Not currently on a statin    Current physical activity: Walks twice daily outside    Clinical ASCVD: No  The ASCVD Risk score (Arnett DK, et al., 2019) failed to calculate for the following reasons:   Cannot find a previous HDL lab   Cannot find a previous total cholesterol lab      Objective:  Lab Results  Component Value Date   HGBA1C 14.5 (H) 08/03/2023    Lab Results  Component Value Date   CREATININE 0.63 08/04/2023   BUN 9 08/04/2023   NA 132 (L) 08/04/2023   K 3.8 08/04/2023   CL 98 08/04/2023   CO2 23 08/04/2023    No results found for: "CHOL", "HDL", "LDLCALC", "LDLDIRECT", "TRIG", "CHOLHDL"  Medications Reviewed Today     Reviewed by Geronimo Krabbe, [facility] (Pharmacist) on 09/18/23 at 1256  Med List Status: <None>   Medication Order Taking? Sig Documenting Provider Last Dose Status Informant  Blood Glucose Monitoring Suppl (ACCU-CHEK GUIDE ME) w/Device KIT [id number] Yes 3 (three) times daily. Use to test blood sugars three times daily [provider] Taking Active   Blood Glucose Monitoring Suppl DEVI [id number] Yes 1 each by Does not apply route 3 (three) times daily. May dispense any manufacturer covered by patient's insurance. Cleven Dallas, DO Taking Active   Continuous Glucose Receiver (  DEXCOM G6 RECEIVER) DEVI [id number]  Use as directed Verma Gobble, NP  Active   Continuous Glucose Sensor (DEXCOM G6 SENSOR) MISC [id number]  1 Device by Other route daily. Verma Gobble, NP  Active   Continuous Glucose Transmitter (DEXCOM G6 TRANSMITTER) MISC [id number]  Use as directed Verma Gobble, NP  Active    Glucose Blood (BLOOD GLUCOSE TEST STRIPS) STRP [id number]  1 each by Does not apply route 3 (three) times daily. Use as directed to check blood sugar. May dispense any manufacturer covered by patient's insurance and fits patient's device. Cleven Dallas, DO  Active   insulin  glargine (LANTUS ) 100 UNIT/ML injection [id number] Yes Inject 10 Units into the skin at bedtime. [provider] Taking Active   insulin  lispro (HUMALOG  KWIKPEN) 100 UNIT/ML KwikPen [id number] Yes Inject 4 Units into the skin with breakfast, with lunch, and with evening meal.  Patient taking differently: Inject 6-7 Units into the skin with breakfast, with lunch, and with evening meal.   Cleven Dallas, DO Taking Active   Insulin  Pen Needle (PEN NEEDLES) 31G X 5 MM MISC [id number] Yes 1 each by Does not apply route 3 (three) times daily. May dispense any manufacturer covered by patient's insurance. Cleven Dallas, DO Taking Active   Lancet Device MISC [id number] Yes 1 each by Does not apply route 3 (three) times daily. May dispense any manufacturer covered by patient's insurance. Cleven Dallas, DO Taking Active   Lancets MISC [id number] Yes 1 each by Does not apply route 3 (three) times daily. Use as directed to check blood sugar. May dispense any manufacturer covered by patient's insurance and fits patient's device. Cleven Dallas, DO Taking Active               Assessment/Plan:   Diabetes: - Currently uncontrolled HgA1c 14.3% - Reviewed long term cardiovascular and renal outcomes of uncontrolled blood sugar - Reviewed goal A1c, goal fasting, and goal 2 hour post prandial glucose - Reviewed dietary modifications including continuing to decrease sodas and refined carbs - Patient denies personal or family history of multiple endocrine neoplasia type 2, medullary thyroid cancer; personal history of pancreatitis or gallbladder disease. - Recommend to check glucose 2-3 times per day -Recommend she be started on  the Dexcom 7 sensors. The Dexcom7 is the newest technology and does not require a transmitter (it is all in one).  -Recommend she continue current therapy but also consider GLP1 therapy if appropriate.  -Recommend starting statin therapy.  Follow Up Plan:   Pend/Send new prescription for Dexcom7 sensors to the Patient's Pharmacy.  (Patient said she should be able to get the Dexcom filled on 09/20/23 per her insurance) Educate Patient about Dexcom 7 once she receives the new sensors. Reach out to the Provider about being added to the Clinic's Dexcom Clarity portal. Follow up with Patient next week.   Geronimo Krabbe, PharmD, [facility] Clinical Pharmacist  [phone]    Geronimo Krabbe, PharmD, BCACP Clinical Pharmacist [phone]

## 2023-09-19 ENCOUNTER — Encounter: Payer: Self-pay | Admitting: Pharmacist

## 2023-09-19 MED ORDER — DEXCOM G7 SENSOR MISC
3 refills | Status: DC
Start: 2023-09-19 — End: 2023-10-16

## 2023-09-20 ENCOUNTER — Other Ambulatory Visit: Payer: Self-pay | Admitting: Student

## 2023-09-23 NOTE — Telephone Encounter (Signed)
 NOT [facility] PATIENT

## 2023-09-26 ENCOUNTER — Telehealth: Payer: Self-pay | Admitting: Pharmacist

## 2023-09-26 DIAGNOSIS — Z794 Long term (current) use of insulin: Secondary | ICD-10-CM

## 2023-09-26 NOTE — Progress Notes (Signed)
   09/26/2023  Patient ID: [name], female   DOB: [birth year], 53 y.o.   MRN: [id number]  Called Patient to follow up on Dexcom.  Dexcom G7 was sent in for her last week.  She had G6 sensors and was trying to use the G7 application. Unfortunately, Patient did not answer the phone. HIPAA compliant message was left requesting a call back.   Plan: Will call the Patient back in 5-7 business days. Geronimo Krabbe, PharmD, BCACP Clinical Pharmacist [phone]

## 2023-10-01 LAB — COLOGUARD: COLOGUARD: NEGATIVE

## 2023-10-02 ENCOUNTER — Ambulatory Visit: Payer: Self-pay | Admitting: Nurse Practitioner

## 2023-10-11 ENCOUNTER — Encounter: Payer: Self-pay | Admitting: Pharmacist

## 2023-10-11 NOTE — Progress Notes (Signed)
 error

## 2023-10-16 ENCOUNTER — Telehealth: Payer: Self-pay | Admitting: Pharmacist

## 2023-10-16 DIAGNOSIS — Z794 Long term (current) use of insulin: Secondary | ICD-10-CM

## 2023-10-16 MED ORDER — DEXCOM G7 SENSOR MISC: 3 refills | Status: DC

## 2023-10-16 NOTE — Progress Notes (Signed)
   10/16/2023  Patient ID: Allena Alston, female   DOB: Sep 10, 1970, 53 y.o.   MRN: [id number]  Patient was called regarding Dexcom G7 sensors and software. Unfortunately, she did not answer her phone. HIPAA compliant message was left on her voicemail.  A1c-14.5% 2 months ago LDL-not available-not on statin therapy  Upcoming appt 12/11/23 with Endocrinology 12/16/23 with PCP  Reviewed Patient's Pharmacy fill data, Dexcom G6 was filled 10/10/23 a new prescription was sent for Dexcom but with the incorrect dosage. Sensors should be changed every 10 days.  Dexcom G6 will eventually be phased out.  Sent refill prescription for Dexcom G7 sensors cosignature required.    Plan: Call Patient back in 1 week.   [name], PharmD, BCACP Clinical Pharmacist [phone]

## 2023-10-28 ENCOUNTER — Telehealth: Payer: Self-pay | Admitting: Pharmacist

## 2023-10-28 DIAGNOSIS — E1141 Type 2 diabetes mellitus with diabetic mononeuropathy: Secondary | ICD-10-CM

## 2023-10-28 MED ORDER — DEXCOM G7 RECEIVER DEVI: 3 refills | Status: AC

## 2023-10-28 MED ORDER — ACCU-CHEK GUIDE TEST VI STRP: ORAL_STRIP | 12 refills | Status: DC

## 2023-10-28 MED ORDER — DEXCOM G7 SENSOR MISC: 3 refills | Status: DC

## 2023-10-28 NOTE — Progress Notes (Signed)
   10/28/2023  Patient ID: [name], female   DOB: Sep 05, 1970, 53 y.o.   MRN: [id number]  Patient called me back from my calls to her over the last few weeks.  She shared that she was not able to pick up her Dexcom G7 sensors as the pharmacy told her they were not called in.  [facility]  The Dexcom G7 sensors were sent to [facility] along with a Dexcom G7 receiver and a refill of her Accu-Chek guide test strips.  Patient said her blood sugars have averaged 135 mg/dl.  She wondered if the clinic staff could see her progress on the Clarity app for the clinic.  Unfortunately, this has not been set up yet to my knowledge.    A1c- >14%-Recently started basal/bolus therapy. B/P in clinic not at goal 140/86-consider ACE/ARB therapy LDL-not available consider statin therapy due to recent diabetes diagnosis (cannot calculate ASCVD risk without recent lipid values)   Upcoming appt with Endocrinology 12/11/23 Upcoming PCP appointment 12/16/23   Plan: Follow up with Patient in 2-3 business days. Route note to PCP about Dexcom Clarity for the Clinic.   [name], PharmD, BCACP Clinical Pharmacist [phone]

## 2023-11-19 ENCOUNTER — Telehealth: Payer: Self-pay | Admitting: Pharmacist

## 2023-11-19 DIAGNOSIS — Z794 Long term (current) use of insulin: Secondary | ICD-10-CM

## 2023-11-19 MED ORDER — ACCU-CHEK GUIDE TEST VI STRP
ORAL_STRIP | 12 refills | Status: AC
Start: 2023-11-19 — End: ?

## 2023-11-19 NOTE — Progress Notes (Signed)
   11/19/2023  Patient ID: [name], female   DOB: December 11, 1970, 53 y.o.   MRN: [id number]  Patient was called to follow up on diabetes management. HIPAA identifiers were obtained.    Patient was recently diagnosed with Type 2 diabetes.  Her last HgA1c was 14.5%  She confirmed receipt of the Dexcom G7 sensors,  She reported her blood sugars have been averaging in the 140s and that she requested refills on her Accu-Chek guide test strips.  Refills were sent per testing supply protocol.  Medications Reviewed Today     Reviewed by [name], [facility] (Pharmacist) on 11/19/23 at 1433  Med List Status: <None>   Medication Order Taking? Sig Documenting Provider Last Dose Status Informant  Blood Glucose Monitoring Suppl (ACCU-CHEK GUIDE ME) w/Device KIT [id number] Yes 3 (three) times daily. Use to test blood sugars three times daily [provider]  Active   Blood Glucose Monitoring Suppl DEVI [id number] Yes 1 each by Does not apply route 3 (three) times daily. May dispense any manufacturer covered by patient's insurance. [name], DO  Active   Continuous Glucose Receiver [facility] G7 [place]) DEVI [id number] Yes Use with Dexcom G7 Sensors [name], NP  Active   Continuous Glucose Sensor (DEXCOM G7 [place]) [place] [id number] Yes Apply 1 sensor every 10 days [name], NP  Active     Discontinued 11/19/23 1355 (Change in therapy)   glucose blood (ACCU-CHEK GUIDE TEST) test strip [id number] Yes Use to check blood sugar three times day [name], [name], NP  Active   insulin  glargine (LANTUS ) 100 UNIT/ML injection [id number] Yes Inject 10 Units into the skin at bedtime. [provider]  Active   insulin  lispro (HUMALOG  KWIKPEN) 100 UNIT/ML KwikPen [id number] Yes Inject 4 Units into the skin with breakfast, with lunch, and with evening meal.  Patient taking differently: Inject 6-7 Units into the skin with breakfast, with lunch, and with evening meal.    [name], DO  Active   Insulin  Pen Needle (PEN NEEDLES) 31G X 5 MM MISC [id number] Yes 1 each by Does not apply route 3 (three) times daily. May dispense any manufacturer covered by patient's insurance. [name], DO  Active   Lancet Device MISC [id number] Yes 1 each by Does not apply route 3 (three) times daily. May dispense any manufacturer covered by patient's insurance. [name], DO  Active   Lancets MISC [id number] Yes 1 each by Does not apply route 3 (three) times daily. Use as directed to check blood sugar. May dispense any manufacturer covered by patient's insurance and fits patient's device. [name], DO  Active            Plan: Send note to Provider about the option of setting up a Dexcom Clarity portal. Follow up with the Patient after her upcoming PCP appointment and upcoming Endocrinologist appointment.   Cassius DOROTHA Jolee, PharmD, BCACP Clinical Pharmacist [phone]

## 2023-12-05 ENCOUNTER — Encounter

## 2023-12-05 DIAGNOSIS — Z1231 Encounter for screening mammogram for malignant neoplasm of breast: Secondary | ICD-10-CM

## 2023-12-11 ENCOUNTER — Ambulatory Visit (INDEPENDENT_AMBULATORY_CARE_PROVIDER_SITE_OTHER): Admitting: "Endocrinology

## 2023-12-11 VITALS — BP 122/80 | HR 75 | Ht 66.0 in | Wt 148.0 lb

## 2023-12-11 DIAGNOSIS — E1021 Type 1 diabetes mellitus with diabetic nephropathy: Secondary | ICD-10-CM

## 2023-12-11 DIAGNOSIS — Z794 Long term (current) use of insulin: Secondary | ICD-10-CM

## 2023-12-11 LAB — POCT GLYCOSYLATED HEMOGLOBIN (HGB A1C): Hemoglobin A1C: 6.3 % — AB (ref 4.0–5.6)

## 2023-12-11 MED ORDER — BAQSIMI ONE PACK 3 MG/DOSE NA POWD: 1.0000 | NASAL | 3 refills | Status: AC | PRN

## 2023-12-11 MED ORDER — BAQSIMI ONE PACK 3 MG/DOSE NA POWD: 1.0000 | NASAL | 3 refills | Status: DC | PRN

## 2023-12-11 NOTE — Patient Instructions (Addendum)
 Will recommend the following: Lantuss 17 units at bedtime Humalog  4 units based on scale 15 min before meals  Humalog  Correction scale: Use in addition to your meal time/short acting insulin  based on blood sugars as follows:  151 - 190: 1 unit 191 - 230: 2 units 231 - 270: 3 units 271 - 310: 4 units 311 - 350: 5 units 351 - 390: 6 units 391 - 430: 7 units   ____________   Goals of DM therapy:  Morning Fasting blood sugar: 80-140  Blood sugar before meals: 80-140 Bed time blood sugar: 100-150  A1C <7%, limited only by hypoglycemia  1.Diabetes medications and their side effects discussed, including hypoglycemia    2. Check blood glucose:  a) Always check blood sugars before driving. Please see below (under hypoglycemia) on how to manage b) Check a minimum of 3 times/day or more as needed when having symptoms of hypoglycemia.   c) Try to check blood glucose before sleeping/in the middle of the night to ensure that it is remaining stable and not dropping less than 100 d) Check blood glucose more often if sick  3. Diet: a) 3 meals per day schedule b: Restrict carbs to 60-70 grams (4 servings) per meal c) Colorful vegetables - 3 servings a day, and low sugar fruit 2 servings/day Plate control method: 1/4 plate protein, 1/4 starch, 1/2 green, yellow, or red vegetables d) Avoid carbohydrate snacks unless hypoglycemic episode, or increased physical activity  4. Regular exercise as tolerated, preferably 3 or more hours a week  5. Hypoglycemia: a)  Do not drive or operate machinery without first testing blood glucose to assure it is over 90 mg%, or if dizzy, lightheaded, not feeling normal, etc, or  if foot or leg is numb or weak. b)  If blood glucose less than 70, take four 5gm Glucose tabs or 15-30 gm Glucose gel.  Repeat every 15 min as needed until blood sugar is >100 mg/dl. If hypoglycemia persists then call 911.   6. Sick day management: a) Check blood glucose more often b)  Continue usual therapy if blood sugars are elevated.   7. Contact the doctor immediately if blood glucose is frequently <60 mg/dl, or an episode of severe hypoglycemia occurs (where someone had to give you glucose/  glucagon  or if you passed out from a low blood glucose), or if blood glucose is persistently >350 mg/dl, for further management  8. A change in level of physical activity or exercise and a change in diet may also affect your blood sugar. Check blood sugars more often and call if needed.  Instructions: 1. Bring glucose meter, blood glucose records on every visit for review 2. Continue to follow up with primary care physician and other providers for medical care 3. Yearly eye  and foot exam 4. Please get blood work done prior to the next appointment

## 2023-12-11 NOTE — Progress Notes (Signed)
 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  Other orders -     Discontinue: Glucagon  (BAQSIMI  ONE PACK) 3 MG/DOSE POWD; Place 1 Device into the nose as needed (Low blood sugar with impaired consciousness).   Diabetes Type II complicated by neuropathy, No results found for: GFR Hba1c goal less than 7, current Hba1c is  Lab Results  Component Value Date   HGBA1C 6.3 (A) 12/11/2023   Will recommend the following: Lantuss 17 units at bedtime Humalog  4 units based on scale 15 min before meals  Humalog  Correction scale: Use in addition to your meal time/short acting insulin  based on blood sugars as follows:  151 - 190: 1 unit 191 - 230: 2 units 231 - 270: 3 units 271 - 310: 4 units 311 - 350: 5 units 351 - 390: 6 units 391 - 430: 7 units   No known contraindications/side effects to any of above medications Glucagon  discussed and prescribed with refills on 12/11/23   -Last LD and Tg are as follows: No results found for: LDLCALC No results found for: TRIG -not on statin  -Follow low fat diet and exercise   -Blood pressure goal <140/90 - Microalbumin/creatinine goal is < 30 -Last MA/Cr is as follows: Lab Results  Component Value Date   MICROALBUR 23.5 (H) 08/04/2023   -not on ACE/ARB  -diet changes including salt restriction -limit eating outside -counseled BP targets per standards of diabetes care -uncontrolled  blood pressure can lead to retinopathy, nephropathy and cardiovascular and atherosclerotic heart disease  Reviewed and counseled on: -A1C target -Blood sugar targets -Complications of uncontrolled diabetes  -Checking blood sugar before meals and bedtime and bring log next visit -All medications with mechanism of action and side effects -Hypoglycemia management: rule of 15's, Glucagon  Emergency Kit and medical alert ID -low-carb low-fat plate-method diet -At least 20 minutes of physical activity per day -Annual dilated retinal eye exam and foot exam -compliance and follow up needs -follow up as scheduled or earlier if problem gets worse  Call if blood sugar is less than 70 or consistently above 250    Take a 15 gm snack of carbohydrate at bedtime before you go to sleep if your blood sugar is less than 100.    If you are going to fast after midnight for a test or procedure, ask your physician for instructions on how to reduce/decrease your insulin  dose.    Call if blood sugar is less than 70 or consistently above 250  -Treating a low sugar by rule of 15  (15 gms of sugar every 15 min until sugar is more than 70) If you feel your sugar is low, test your sugar to be sure If your sugar is low (less than 70), then take 15 grams of a fast acting Carbohydrate (3-4 glucose tablets or glucose gel or 4 ounces of juice or regular soda) Recheck your sugar 15 min after  treating low to make sure it is more than 70 If sugar is still less than 70, treat again with 15 grams of carbohydrate          Don't drive the hour of hypoglycemia  If unconscious/unable to eat or drink by mouth, use glucagon  injection or nasal spray baqsimi  and call 911. Can repeat again in 15 min if still unconscious.  Return in about 6 weeks (around 01/22/2024).   I have reviewed current medications, nurse's notes, allergies, vital signs, past medical and surgical history, family medical history, and social history for this  encounter. Counseled patient on symptoms, examination findings, lab findings, imaging results, treatment decisions and monitoring and prognosis. The patient understood the recommendations and agrees with the treatment plan. All questions regarding treatment plan were fully answered.  [name], MD  12/11/23    History of Present Illness [name] is a 53 y.o. year old female who presents for evaluation of Type II diabetes mellitus.  [name] was first diagnosed in 07/2023.   Diabetes education -  Home diabetes regimen: Lantuss 17 units at bedtime Humalog  4-10 units based on scale   COMPLICATIONS -  MI/Stroke -  retinopathy +  neuropathy -  nephropathy  SYMPTOMS REVIEWED - Polyuria - Weight loss - Blurred vision  BLOOD SUGAR DATA CGM interpretation: At today's visit, we reviewed her CGM downloads. The full report is scanned in the media. Reviewing the CGM trends, BG are well controlled across the day,    Physical Exam  BP 122/80   Pulse 75   Ht 5' 6 (1.676 m)   Wt 148 lb (67.1 kg)   SpO2 98%   BMI 23.89 kg/m    Constitutional: well developed, well nourished Head: normocephalic, atraumatic Eyes: sclera anicteric, no redness Neck: supple Lungs: normal respiratory effort Neurology: alert and oriented Skin: dry, no appreciable rashes Musculoskeletal: no appreciable defects Psychiatric: normal mood and affect Diabetic Foot Exam - Simple   No data filed      Current Medications Patient's Medications  New Prescriptions   GLUCAGON  (BAQSIMI  ONE PACK) 3 MG/DOSE POWD    Place 1 Device into the nose as needed (Low blood sugar with impaired consciousness).  Previous Medications   BLOOD GLUCOSE MONITORING SUPPL (ACCU-CHEK GUIDE ME) W/DEVICE KIT    3 (three) times daily. Use to test blood sugars three times daily   BLOOD GLUCOSE MONITORING SUPPL DEVI    1 each by Does not apply route 3 (three) times daily. May dispense any manufacturer covered  by patient's insurance.   CONTINUOUS GLUCOSE RECEIVER (DEXCOM G7 RECEIVER) DEVI    Use with Dexcom G7 Sensors   CONTINUOUS GLUCOSE SENSOR (DEXCOM G7 SENSOR) MISC    Apply 1 sensor every 10 days   GLUCOSE BLOOD (ACCU-CHEK GUIDE TEST) TEST STRIP    Use to check blood sugar three times day   INSULIN  GLARGINE (LANTUS ) 100 UNIT/ML INJECTION    Inject 10 Units into the skin at bedtime.   INSULIN  LISPRO (HUMALOG  KWIKPEN) 100 UNIT/ML KWIKPEN    Inject 4 Units into the skin with breakfast, with lunch, and with evening meal.   INSULIN  PEN NEEDLE (PEN NEEDLES) 31G X 5 MM MISC    1 each by Does not apply route 3 (three) times daily. May dispense any manufacturer covered by patient's insurance.   LANCET DEVICE MISC    1 each by Does not apply route 3 (three) times daily. May dispense any manufacturer covered by patient's insurance.  LANCETS MISC    1 each by Does not apply route 3 (three) times daily. Use as directed to check blood sugar. May dispense any manufacturer covered by patient's insurance and fits patient's device.  Modified Medications   No medications on file  Discontinued Medications   No medications on file    Allergies No Known Allergies  Past Medical History Past Medical History:  Diagnosis Date   Allergy    Tobacco abuse    Uterine fibroid     Past Surgical History No past surgical history on file.  Family History family history includes Cancer in her father; Cancer (age of onset: 1) in her mother; Diabetes in her brother and father; Heart disease in her father; Hyperlipidemia in her father; Kidney disease in her brother and father.  Social History Social History   Socioeconomic History   Marital status: Married    Spouse name: Not on file   Number of children: 0   Years of education: Not on file   Highest education level: Associate degree: occupational, Scientist, product/process development, or vocational program  Occupational History   Not on file  Tobacco Use   Smoking status: Some Days     Current packs/day: 0.13    Average packs/day: 0.1 packs/day for 20.6 years (2.7 ttl pk-yrs)    Types: Cigarettes    Start date: 2005   Smokeless tobacco: Never  Substance and Sexual Activity   Alcohol use: Yes    Alcohol/week: 4.0 standard drinks of alcohol    Types: 4 Shots of liquor per week    Comment: drink every other day   Drug use: Never    Comment: hx of drug use   Sexual activity: Yes    Birth control/protection: None, Post-menopausal  Other Topics Concern   Not on file  Social History Narrative   Not on file   Social Drivers of Health   Financial Resource Strain: Medium Risk (08/22/2023)   Overall Financial Resource Strain (CARDIA)    Difficulty of Paying Living Expenses: Somewhat hard  Food Insecurity: Food Insecurity Present (08/22/2023)   Hunger Vital Sign    Worried About Running Out of Food in the Last Year: Sometimes true    Ran Out of Food in the Last Year: Sometimes true  Transportation Needs: No Transportation Needs (08/22/2023)   PRAPARE - Administrator, Civil Service (Medical): No    Lack of Transportation (Non-Medical): No  Physical Activity: Not on file  Stress: Not on file  Social Connections: Socially Integrated (08/22/2023)   Social Connection and Isolation Panel    Frequency of Communication with Friends and Family: More than three times a week    Frequency of Social Gatherings with Friends and Family: More than three times a week    Attends Religious Services: 1 to 4 times per year    Active Member of [employer] or Organizations: Yes    Attends Banker Meetings: 1 to 4 times per year    Marital Status: Married  Catering manager Violence: Not At Risk (08/03/2023)   Humiliation, Afraid, Rape, and Kick questionnaire    Fear of Current or Ex-Partner: No    Emotionally Abused: No    Physically Abused: No    Sexually Abused: No    Lab Results  Component Value Date   HGBA1C 6.3 (A) 12/11/2023   HGBA1C 14.5 (H) 08/03/2023   HGBA1C  5.5 05/26/2015   No results found for: CHOL No results found for: HDL No results found  for: LDLCALC No results found for: TRIG No results found for: [facility] Lab Results  Component Value Date   CREATININE 0.63 08/04/2023   No results found for: GFR Lab Results  Component Value Date   MICROALBUR 23.5 (H) 08/04/2023      Component Value Date/Time   NA 132 (L) 08/04/2023 0101   K 3.8 08/04/2023 0101   CL 98 08/04/2023 0101   CO2 23 08/04/2023 0101   GLUCOSE 298 (H) 08/04/2023 0101   BUN 9 08/04/2023 0101   CREATININE 0.63 08/04/2023 0101   CALCIUM 8.9 08/04/2023 0101   PROT 8.3 (H) 08/03/2023 1114   ALBUMIN 4.7 08/03/2023 1114   AST 25 08/03/2023 1114   ALT 30 08/03/2023 1114   ALKPHOS 84 08/03/2023 1114   BILITOT 1.4 (H) 08/03/2023 1114   GFRNONAA >60 08/04/2023 0101      Latest Ref Rng & Units 08/04/2023    1:01 AM 08/03/2023    8:33 PM 08/03/2023    2:09 PM  BMP  Glucose 70 - 99 mg/dL 701  689  582   BUN 6 - 20 mg/dL 9  10  14    Creatinine 0.44 - 1.00 mg/dL 9.36  9.29  9.09   Sodium 135 - 145 mmol/L 132  133  132   Potassium 3.5 - 5.1 mmol/L 3.8  3.2  4.4   Chloride 98 - 111 mmol/L 98  101  92   CO2 22 - 32 mmol/L 23  23  22    Calcium 8.9 - 10.3 mg/dL 8.9  9.3  89.7        Component Value Date/Time   WBC 4.6 08/04/2023 0101   RBC 3.98 08/04/2023 0101   HGB 12.5 08/04/2023 0101   HCT 37.3 08/04/2023 0101   PLT 251 08/04/2023 0101   MCV 93.7 08/04/2023 0101   MCH 31.4 08/04/2023 0101   MCHC 33.5 08/04/2023 0101   RDW 12.9 08/04/2023 0101   LYMPHSABS 2.0 08/04/2023 0101   MONOABS 0.4 08/04/2023 0101   EOSABS 0.2 08/04/2023 0101   BASOSABS 0.0 08/04/2023 0101     Parts of this note may have been dictated using voice recognition software. There may be variances in spelling and vocabulary which are unintentional. Not all errors are proofread. Please notify the dino if any discrepancies are noted or if the meaning of any statement is not clear.

## 2023-12-13 ENCOUNTER — Encounter: Payer: Self-pay | Admitting: "Endocrinology

## 2023-12-16 ENCOUNTER — Ambulatory Visit: Admitting: Nurse Practitioner

## 2023-12-16 ENCOUNTER — Encounter

## 2023-12-16 DIAGNOSIS — Z1231 Encounter for screening mammogram for malignant neoplasm of breast: Secondary | ICD-10-CM

## 2023-12-25 ENCOUNTER — Telehealth: Payer: Self-pay | Admitting: Pharmacist

## 2023-12-25 DIAGNOSIS — E1141 Type 2 diabetes mellitus with diabetic mononeuropathy: Secondary | ICD-10-CM

## 2023-12-25 NOTE — Progress Notes (Unsigned)
   12/25/2023  Patient ID: [name], female   DOB: October 06, 1970, 53 y.o.   MRN: [id number]  Patient was called to follow up on diabetes management. Unfortunately, she did not answer the phone. HIPAA compliant message was left on her voicemail.  Patient has Dexcom G7 sensors but is not linked to the [facility] clinic portal.  She has an upcoming Endo appointment 01/22/2024.  She no-showed her appointment at [facility] on 12/16/23  Plan from Endo appointment in August:  Lantuss 17 units at bedtime Humalog  4 units based on scale 15 min before meals  Humalog  Correction scale: Use in addition to your meal time/short acting insulin  based on blood sugars as follows:   151 - 190: 1 unit 191 - 230: 2 units 231 - 270: 3 units 271 - 310: 4 units 311 - 350: 5 units 351 - 390: 6 units 391 - 430: 7 units    ____________     Goals of DM therapy:  Morning Fasting blood sugar: 80-140  Blood sugar before meals: 80-140 Bed time blood sugar: 100-150  A1C <7%, limited only by hypoglycemia   A1c is now 6.3%  Need lipid labs.  ASCVD risk could not be calculated.  Not on statin therapy.   Plan: Follow up after Endo appointment. Sent note to Endo about statin therapy prior to her appointment. No upcoming PCP appointment.   [name], PharmD, BCACP Clinical Pharmacist [phone]

## 2024-01-04 IMAGING — DX DG WRIST COMPLETE 3+V*R*
4 series · 4 of 4 positions shown · non-contrast
Comparison: None.

CLINICAL DATA: Trip and fall

EXAM:
RIGHT WRIST - COMPLETE 3+ VIEW

[wrist pa]
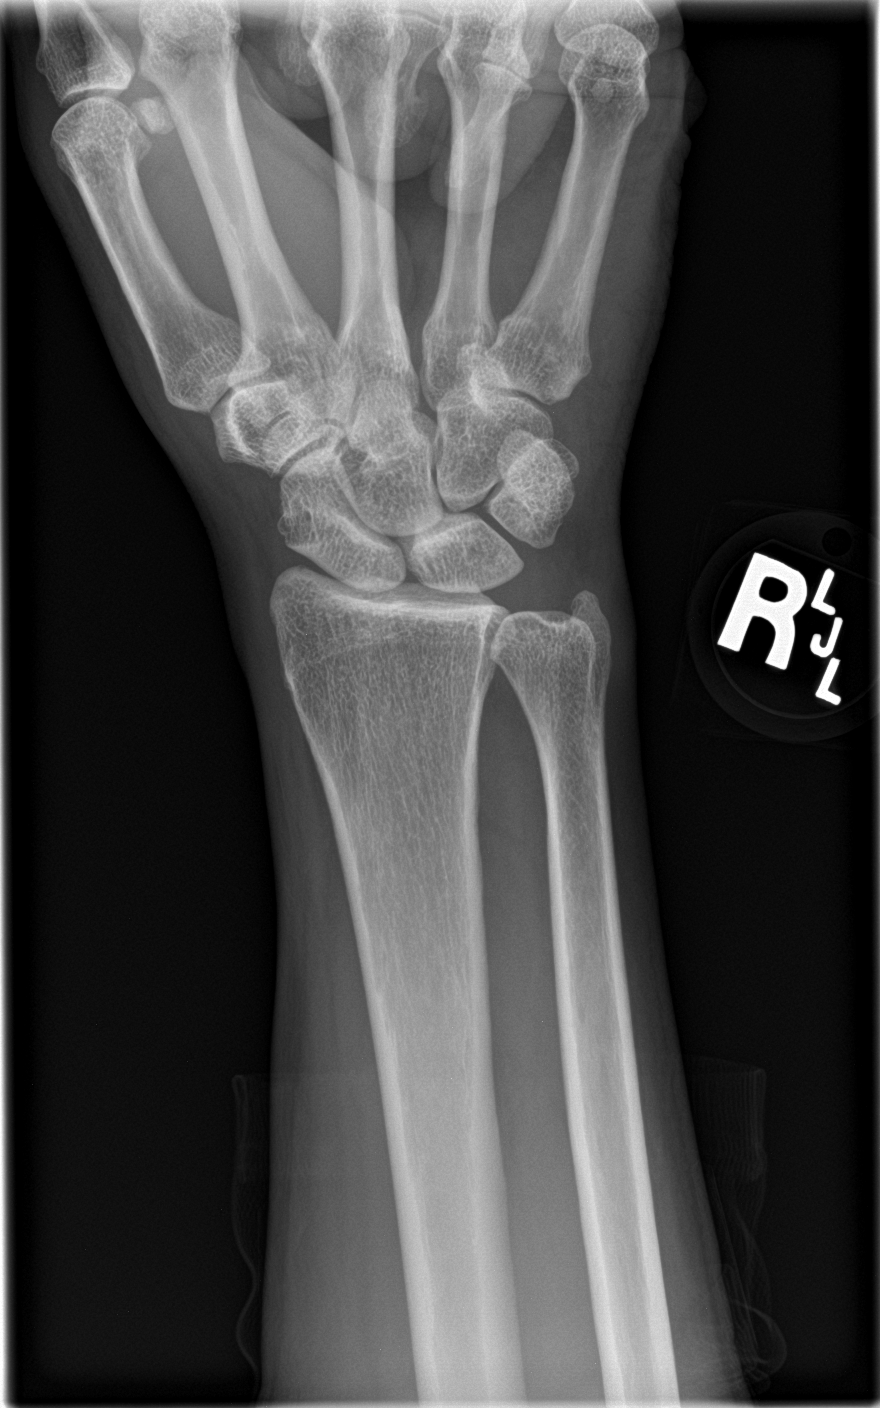

[wrist obl]
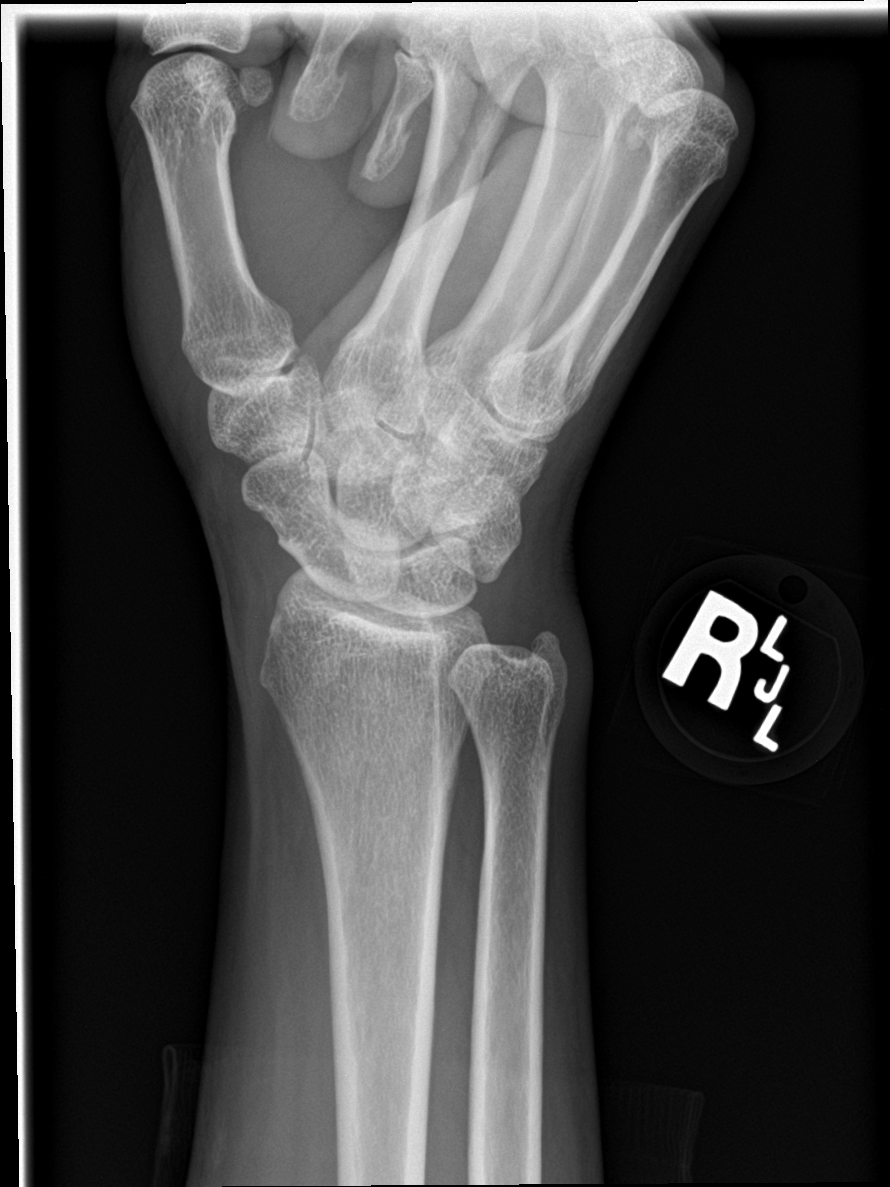

[wrist lat]
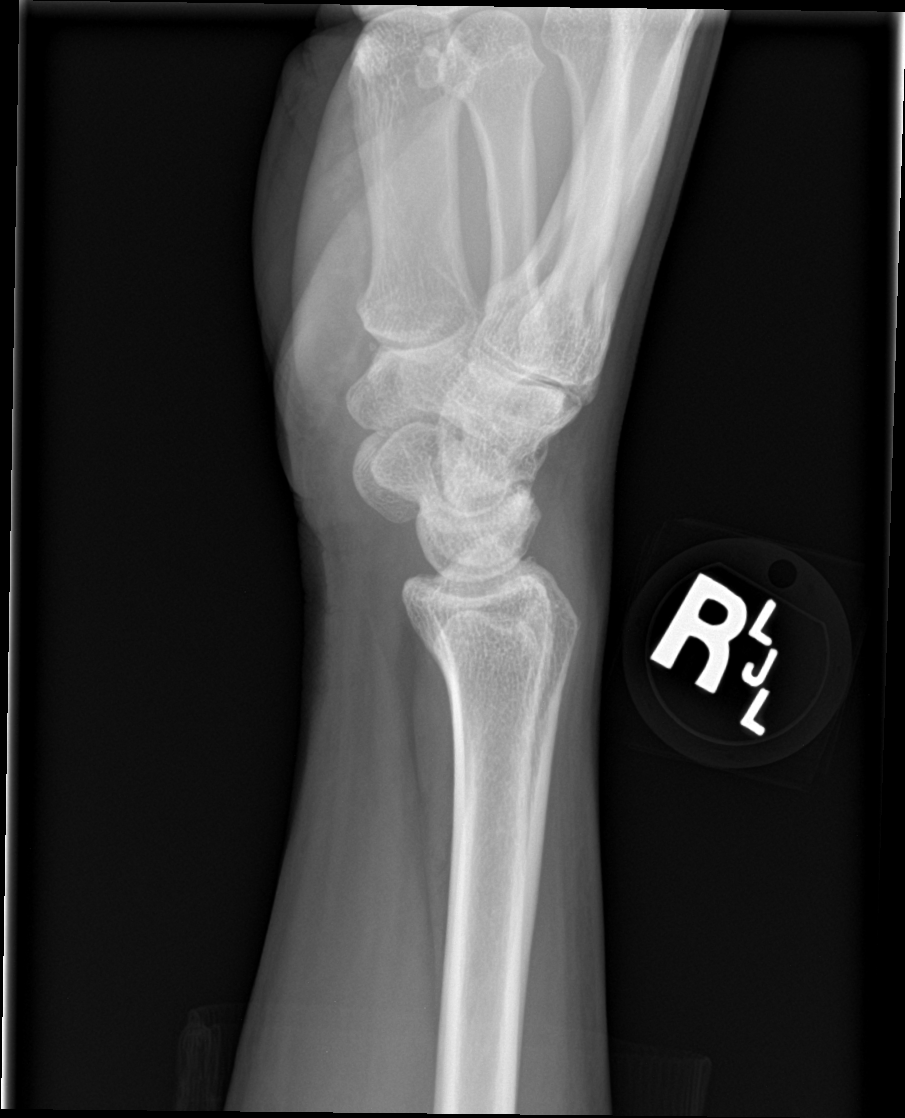

[wrist navicular]
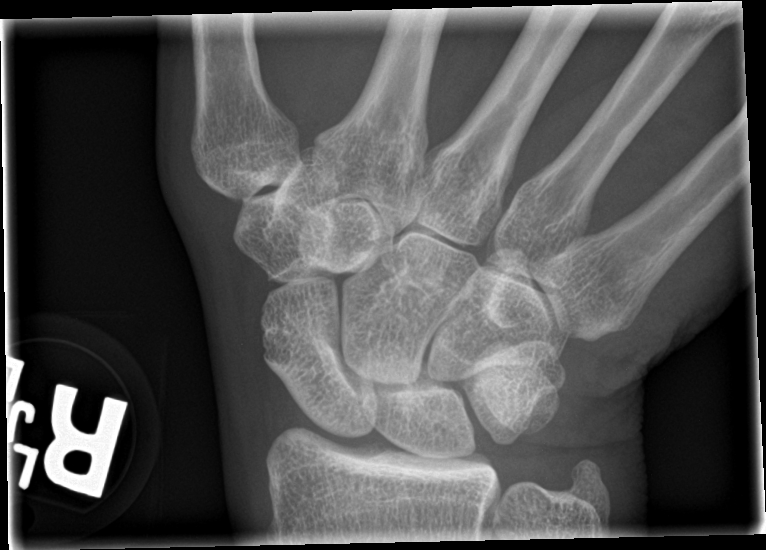

[4 of 4 positions shown; findings below may reference images not displayed]

FINDINGS: There is no evidence of fracture or dislocation. There is no
evidence of arthropathy or other focal bone abnormality. Soft
tissues are unremarkable.
IMPRESSION: No fracture or dislocation of the right wrist. The carpus is
normally aligned.

## 2024-01-14 ENCOUNTER — Telehealth: Payer: Self-pay | Admitting: Pharmacist

## 2024-01-14 ENCOUNTER — Telehealth: Admitting: Family

## 2024-01-14 ENCOUNTER — Encounter: Payer: Self-pay | Admitting: Nurse Practitioner

## 2024-01-14 DIAGNOSIS — Z794 Long term (current) use of insulin: Secondary | ICD-10-CM

## 2024-01-14 DIAGNOSIS — M5431 Sciatica, right side: Secondary | ICD-10-CM | POA: Diagnosis not present

## 2024-01-14 DIAGNOSIS — E1141 Type 2 diabetes mellitus with diabetic mononeuropathy: Secondary | ICD-10-CM | POA: Diagnosis not present

## 2024-01-14 MED ORDER — PREDNISONE 20 MG PO TABS: 40.0000 mg | ORAL_TABLET | Freq: Every day | ORAL | 0 refills | Status: AC

## 2024-01-14 MED ORDER — DICLOFENAC SODIUM 75 MG PO TBEC: 75.0000 mg | DELAYED_RELEASE_TABLET | Freq: Two times a day (BID) | ORAL | 0 refills | Status: DC

## 2024-01-14 MED ORDER — BACLOFEN 10 MG PO TABS: 10.0000 mg | ORAL_TABLET | Freq: Three times a day (TID) | ORAL | 0 refills | Status: DC | PRN

## 2024-01-14 NOTE — Telephone Encounter (Signed)
 She was a no show to her last appt and has no upcoming follow up.  She will need to call to get an appt acutely to be seen in clinic due to 10/10 pain or go to urgent care this evening if unable to wait.

## 2024-01-14 NOTE — Telephone Encounter (Signed)
 MyChart message sent to patient.

## 2024-01-14 NOTE — Progress Notes (Addendum)
   01/14/2024  Patient ID: [name], female   DOB: 12-Jun-1970, 53 y.o.   MRN: [id number]  Patient called my phone because she said she had been unable to reach her PCP.  HIPAA idetifiers were obtained.  She is a 53 year old female recently diagnosed with type 2 diabetes.  Patient said she sent a MyChart message a few days ago about hip pain.  I could not find the message in her chart.  But I encouraged her to send another message.  In the meantime I gathered the following information:  Patient reported having 10/10 pain in her right hip that radiates from her buttock to her calf and sometimes into her toes.   She has been taking ibuprofen , aleve , and tylenol  (nothing seemed to work).  A message was sent to Susannah Barks, CMA with [facility].  Patient was instructed to call the office to schedule an appointment.   Lab Results  Component Value Date   HGBA1C 6.3 (A) 12/11/2023   HGBA1C 14.5 (H) 08/03/2023   HGBA1C 5.5 05/26/2015    New A1c 6.3% (down from >13%) Patient saw endocrinology 12/11/2023   Will recommend the following: Lantus  17 units at bedtime Humalog  4 units based on scale 15 min before meals  Humalog  Correction scale: Use in addition to your meal time/short acting insulin  based on blood sugars as follows:   151 - 190: 1 unit 191 - 230: 2 units 231 - 270: 3 units 271 - 310: 4 units 311 - 350: 5 units 351 - 390: 6 units 391 - 430: 7 units    ____________     Goals of DM therapy:  Morning Fasting blood sugar: 80-140  Blood sugar before meals: 80-140 Bed time blood sugar: 100-150  A1C <7%, limited only by hypoglycemia   Not on statin therapy Clinical ASCVD: No  No Lipid Values The ASCVD Risk score (Arnett DK, et al., 2019) failed to calculate for the following reasons:   Cannot find a previous HDL lab   Cannot find a previous total cholesterol lab   Plan: Route note to PCP (Patient encouraged to call and schedule an appointment.  She missed her 12/16/23 PCP appointment.) Follow up in 2  months since A1c is at goal.   [name], PharmD, BCACP Clinical Pharmacist [phone]  ADDENDUM: Received message from Willam Barks that they can see the Patient as early as tomorrow once she calls to schedule the appointment.   [name], PharmD, BCACP Clinical Pharmacist [phone]

## 2024-01-14 NOTE — Telephone Encounter (Signed)
 Message routed to admin staff to call and schedule patient appointment

## 2024-01-14 NOTE — Patient Instructions (Addendum)
Sciatica  Sciatica is pain, numbness, weakness, or tingling along the path of the sciatic nerve. The sciatic nerve starts in the lower back and runs down the back of each leg. The nerve controls the muscles in the lower leg and in the back of the knee. It also provides feeling (sensation) to the back of the thigh, the lower leg, and the sole of the foot. Sciatica is a symptom of another medical condition that pinches or puts pressure on the sciatic nerve. Sciatica most often only affects one side of the body. Sciatica usually goes away on its own or with treatment. In some cases, sciatica may come back (recur). What are the causes? This condition is caused by pressure on the sciatic nerve or pinching of the nerve. This may be the result of: A disk in between the bones of the spine bulging out too far (herniated disk). Age-related changes in the spinal disks. A pain disorder that affects a muscle in the buttock. Extra bone growth near the sciatic nerve. A break (fracture) of the pelvis. Pregnancy. Tumor. This is rare. What increases the risk? The following factors may make you more likely to develop this condition: Playing sports that place pressure or stress on the spine. Having poor strength and flexibility. A history of back injury or surgery. Sitting for long periods of time. Doing activities that involve repetitive bending or lifting. Obesity. What are the signs or symptoms? Symptoms can vary from mild to very severe. They may include: Any of the following problems in the lower back, leg, hip, or buttock: Mild tingling, numbness, or dull aches. Burning sensations. Sharp pains. Numbness in the back of the calf or the sole of the foot. Leg weakness. Severe back pain that makes movement difficult. Symptoms may get worse when you cough, sneeze, or laugh, or when you sit or stand for long periods of time. How is this diagnosed? This condition may be diagnosed based on: Your symptoms  and medical history. A physical exam. Blood tests. Imaging tests, such as: X-rays. An MRI. A CT scan. How is this treated? In many cases, this condition improves on its own without treatment. However, treatment may include: Reducing or modifying physical activity. Exercising, including strengthening and stretching. Icing and applying heat to the affected area. Medicines that help to: Relieve pain and swelling. Relax your muscles. Injections of medicines that help to relieve pain and inflammation (steroids) around the sciatic nerve. Surgery. Follow these instructions at home: Medicines Take over-the-counter and prescription medicines only as told by your health care provider. Ask your health care provider if the medicine prescribed to you requires you to avoid driving or using heavy machinery. Managing pain     If directed, put ice on the affected area. To do this: Put ice in a plastic bag. Place a towel between your skin and the bag. Leave the ice on for 20 minutes, 2-3 times a day. If your skin turns bright red, remove the ice right away to prevent skin damage. The risk of skin damage is higher if you cannot feel pain, heat, or cold. If directed, apply heat to the affected area as often as told by your health care provider. Use the heat source that your health care provider recommends, such as a moist heat pack or a heating pad. Place a towel between your skin and the heat source. Leave the heat on for 20-30 minutes. If your skin turns bright red, remove the heat right away to prevent [place]. The   risk of burns is higher if you cannot feel pain, heat, or cold. Activity  Return to your normal activities as told by your health care provider. Ask your health care provider what activities are safe for you. Avoid activities that make your symptoms worse. Take brief periods of rest throughout the day. When you rest for longer periods, mix in some mild activity or stretching between  periods of rest. This will help to prevent stiffness and pain. Avoid sitting for long periods of time without moving. Get up and move around at least one time each hour. Exercise and stretch regularly as told by your health care provider. Do not lift anything that is heavier than 10 lb (4.5 kg) until your health care provider says that it is safe. When you do not have symptoms, you should still avoid heavy lifting, especially repetitive heavy lifting. When you lift objects, always use proper lifting technique, which includes: Bending your knees. Keeping the load close to your body. Avoiding twisting. General instructions Maintain a healthy weight. Excess weight puts extra stress on your back. Wear supportive, comfortable shoes. Avoid wearing high heels. Avoid sleeping on a mattress that is too soft or too hard. A mattress that is firm enough to support your back when you sleep may help to reduce your pain. Contact a health care provider if: Your pain is not controlled by medicine. Your pain does not improve or gets worse. Your pain lasts longer than 4 weeks. You have unexplained weight loss. Get help right away if: You are not able to control when you urinate or have bowel movements (incontinence). You have: Weakness in your lower back, pelvis, buttocks, or legs that gets worse. Redness or swelling of your back. A burning sensation when you urinate. Summary Sciatica is pain, numbness, weakness, or tingling along the path of the sciatic nerve, which may include the lower back, legs, hips, and buttocks. This condition is caused by pressure on the sciatic nerve or pinching of the nerve. Treatment often includes rest, exercise, medicines, and applying ice or heat. This information is not intended to replace advice given to you by your health care provider. Make sure you discuss any questions you have with your health care provider.  Sciatica Rehab Ask your health care provider which  exercises are safe for you. Do exercises exactly as told by your health care provider and adjust them as directed. It is normal to feel mild stretching, pulling, tightness, or discomfort as you do these exercises. Stop right away if you feel sudden pain or your pain gets worse. Do not begin these exercises until told by your health care provider. Stretching and range-of-motion exercises These exercises warm up your muscles and joints and improve the movement and flexibility of your hips and back. These exercises also help to relieve pain, numbness, and tingling. Sciatic nerve glide  Sit in a chair with your head facing down toward your chest. Place your hands behind your back. Let your shoulders slump forward. Slowly straighten one of your legs while you tilt your head back as if you are looking toward the ceiling. Only straighten your leg as far as you can without making your symptoms worse. Hold this position for __________ seconds. Slowly return your leg and head back to the starting position. Repeat with your other leg. Repeat __________ times. Complete this exercise __________ times a day. Knee to chest with hip adduction and internal rotation  Lie on your back on a firm surface with both legs  straight. Bend one of your knees and move it up toward your chest until you feel a gentle stretch in your lower back and buttock. Then, move your knee toward the shoulder that is on the opposite side from your leg. This is hip adduction and internal rotation. Hold your leg in this position by holding on to the front of your knee. Hold this position for __________ seconds. Slowly return to the starting position. Repeat with your other leg. Repeat __________ times. Complete this exercise __________ times a day. Prone extension on elbows  Lie on your abdomen on a firm surface. A bed may be too soft for this exercise. Prop yourself up on your elbows. Use your arms to help lift your chest up until you  feel a gentle stretch in your abdomen and your lower back. This will place some of your body weight on your elbows. If this is uncomfortable, try stacking pillows under your chest. Your hips should stay down, against the surface that you are lying on. Keep your hip and back muscles relaxed. Hold this position for __________ seconds. Slowly relax your upper body and return to the starting position. Repeat __________ times. Complete this exercise __________ times a day. Strengthening exercises These exercises build strength and endurance in your back. Endurance is the ability to use your muscles for a long time, even after they get tired. Pelvic tilt This exercise strengthens the muscles that lie deep in the abdomen. Lie on your back on a firm surface. Bend your knees and keep your feet flat on the surface. Tense your abdominal muscles. Tip your pelvis up toward the ceiling and flatten your lower back into the firm surface. To help with this exercise, you may place a small towel under your lower back and try to push your back into the towel. Hold this position for __________ seconds. Let your muscles relax completely before you repeat this exercise. Repeat __________ times. Complete this exercise __________ times a day. Alternating arm and leg raises  Get on your hands and knees on a firm surface. If you are on a hard floor, you may want to use padding, such as an exercise mat, to cushion your knees. Line up your arms and legs. Your hands should be directly below your shoulders, and your knees should be directly below your hips. Lift your left leg behind you. At the same time, raise your right arm and straighten it in front of you. Do not lift your leg higher than your hip. Do not lift your arm higher than your shoulder. Keep your abdominal and back muscles tight. Keep your hips facing the ground. Do not arch your back. Keep your balance carefully, and do not hold your breath. Hold this  position for __________ seconds. Slowly return to the starting position. Repeat with your right leg and your left arm. Repeat __________ times. Complete this exercise __________ times a day. Posture and body mechanics Good posture and healthy body mechanics can help to relieve stress in your body's tissues and joints. Body mechanics refers to the movements and positions of your body while you do your daily activities. Posture is part of body mechanics. Good posture means: Your spine is in its natural S-curve position (neutral). Your shoulders are pulled back slightly. Your head is not tipped forward. Follow these guidelines to improve your posture and body mechanics in your everyday activities. Standing  When standing, keep your spine neutral and your feet about hip width apart. Keep a slight bend  in your knees. Your ears, shoulders, and hips should line up. When you do a task in which you stand in one place for a long time, place one foot up on a stable object that is 2-4 inches (5-10 cm) high, such as a footstool. This helps keep your spine neutral. Sitting  When sitting, keep your spine neutral and keep your feet flat on the floor. Use a footrest, if necessary, and keep your thighs parallel to the floor. Avoid rounding your shoulders, and avoid tilting your head forward. When working at a desk or a computer, keep your desk at a height where your hands are slightly lower than your elbows. Slide your chair under your desk so you are close enough to maintain good posture. When working at a computer, place your monitor at a height where you are looking straight ahead and you do not have to tilt your head forward or downward to look at the screen. Resting  When lying down and resting, avoid positions that are most painful for you. If you have pain with activities such as sitting, bending, stooping, or squatting, lie in a position in which your body does not bend very much. For example, avoid  curling up on your side with your arms and knees near your chest (fetal position). If you have pain with activities such as standing for a long time or reaching with your arms, lie with your spine in a neutral position and bend your knees slightly. Try the following positions: Lying on your side with a pillow between your knees. Lying on your back with a pillow under your knees. Lifting  When lifting objects, keep your feet at least shoulder width apart and tighten your abdominal muscles. Bend your knees and hips and keep your spine neutral. It is important to lift using the strength of your legs, not your back. Do not lock your knees straight out. Always ask for help to lift heavy or awkward objects. This information is not intended to replace advice given to you by your health care provider. Make sure you discuss any questions you have with your health care provider. Document Revised: 07/18/2021 Document Reviewed: 07/18/2021 Elsevier Patient Education  2024 [employer]  Document Revised: 07/17/2021 Document Reviewed: 07/17/2021 Elsevier Patient Education  2024 [employer].

## 2024-01-14 NOTE — Progress Notes (Signed)
 Virtual Visit Consent   [name], you are scheduled for a virtual visit with a [facility] provider today. Just as with appointments in the office, your consent must be obtained to participate. Your consent will be active for this visit and any virtual visit you may have with one of our providers in the next 365 days. If you have a MyChart account, a copy of this consent can be sent to you electronically.  As this is a virtual visit, video technology does not allow for your provider to perform a traditional examination. This may limit your provider's ability to fully assess your condition. If your provider identifies any concerns that need to be evaluated in person or the need to arrange testing (such as labs, EKG, etc.), we will make arrangements to do so. Although advances in technology are sophisticated, we cannot ensure that it will always work on either your end or our end. If the connection with a video visit is poor, the visit may have to be switched to a telephone visit. With either a video or telephone visit, we are not always able to ensure that we have a secure connection.  By engaging in this virtual visit, you consent to the provision of healthcare and authorize for your insurance to be billed (if applicable) for the services provided during this visit. Depending on your insurance coverage, you may receive a charge related to this service.  I need to obtain your verbal consent now. Are you willing to proceed with your visit today? Hellon Stark has provided verbal consent on 01/14/2024 for a virtual visit (video or telephone). Bari Learn, FNP  Date: 01/14/2024 7:31 PM   Virtual Visit via Video Note   I, Bari Learn, connected with  Eleyna Brugh  ([id number], [birth year]) on 01/14/24 at  7:30 PM EDT by a video-enabled telemedicine application and verified that I am speaking with the correct person using two identifiers.  Location: Patient: Virtual Visit  Location Patient: Home Provider: Virtual Visit Location Provider: Home Office   I discussed the limitations of evaluation and management by telemedicine and the availability of in person appointments. The patient expressed understanding and agreed to proceed.    History of Present Illness: [name] is a 53 y.o. who identifies as a female who was assigned female at birth, and is being seen today for right buttocks paint that radiates down her right thigh.  HPI: Back Pain This is a new problem. The current episode started 1 to 4 weeks ago. The problem occurs constantly. The problem is unchanged. The pain is present in the gluteal. The quality of the pain is described as aching. The pain radiates to the right thigh. The pain is at a severity of 10/10. The pain is moderate. Associated symptoms include numbness, tingling and weakness. She has tried NSAIDs for the symptoms. The treatment provided mild relief.    Problems:  Patient Active Problem List   Diagnosis Date Noted   Type 2 diabetes mellitus with diabetic mononeuropathy, with long-term current use of insulin  (HCC) 09/02/2023   Bilateral leg edema 09/02/2023   Abnormal uterine bleeding 05/26/2015    Allergies: No Known Allergies Medications:  Current Outpatient Medications:    baclofen  (LIORESAL ) 10 MG tablet, Take 1 tablet (10 mg total) by mouth 3 (three) times daily as needed for muscle spasms., Disp: 30 each, Rfl: 0   diclofenac  (VOLTAREN ) 75 MG EC tablet, Take 1 tablet (75 mg total) by mouth 2 (two) times daily., Disp: 30 tablet,  Rfl: 0   predniSONE  (DELTASONE ) 20 MG tablet, Take 2 tablets (40 mg total) by mouth daily with breakfast for 5 days., Disp: 10 tablet, Rfl: 0   Blood Glucose Monitoring Suppl (ACCU-CHEK GUIDE ME) w/Device KIT, 3 (three) times daily. Use to test blood sugars three times daily, Disp: , Rfl:    Blood Glucose Monitoring Suppl DEVI, 1 each by Does not apply route 3 (three) times daily. May dispense any  manufacturer covered by patient's insurance., Disp: 1 each, Rfl: 0   Continuous Glucose Receiver (DEXCOM G7 RECEIVER) DEVI, Use with Dexcom G7 Sensors, Disp: 1 each, Rfl: 3   Continuous Glucose Sensor (DEXCOM G7 SENSOR) MISC, Apply 1 sensor every 10 days, Disp: 3 each, Rfl: 3   Glucagon  (BAQSIMI  ONE PACK) 3 MG/DOSE POWD, Place 1 Device into the nose as needed (Low blood sugar with impaired consciousness)., Disp: 2 each, Rfl: 3   glucose blood (ACCU-CHEK GUIDE TEST) test strip, Use to check blood sugar three times day, Disp: 100 each, Rfl: 12   insulin  glargine (LANTUS ) 100 UNIT/ML injection, Inject 10 Units into the skin at bedtime. (Patient taking differently: Inject 17 Units into the skin at bedtime.), Disp: , Rfl:    insulin  lispro (HUMALOG  KWIKPEN) 100 UNIT/ML KwikPen, Inject 4 Units into the skin with breakfast, with lunch, and with evening meal., Disp: 15 mL, Rfl: 2   Insulin  Pen Needle (PEN NEEDLES) 31G X 5 MM MISC, 1 each by Does not apply route 3 (three) times daily. May dispense any manufacturer covered by patient's insurance., Disp: 100 each, Rfl: 0   Lancet Device MISC, 1 each by Does not apply route 3 (three) times daily. May dispense any manufacturer covered by patient's insurance., Disp: 1 each, Rfl: 0   Lancets MISC, 1 each by Does not apply route 3 (three) times daily. Use as directed to check blood sugar. May dispense any manufacturer covered by patient's insurance and fits patient's device., Disp: 100 each, Rfl: 0  Observations/Objective: Patient is well-developed, well-nourished in no acute distress.  Resting comfortably  at home.  Head is normocephalic, atraumatic.  No labored breathing.  Speech is clear and coherent with logical content.  Patient is alert and oriented at baseline.  Pain in right buttocks with flexion  Assessment and Plan: 1. Sciatica of right side (Primary) - baclofen  (LIORESAL ) 10 MG tablet; Take 1 tablet (10 mg total) by mouth 3 (three) times daily as  needed for muscle spasms.  Dispense: 30 each; Refill: 0 - diclofenac  (VOLTAREN ) 75 MG EC tablet; Take 1 tablet (75 mg total) by mouth 2 (two) times daily.  Dispense: 30 tablet; Refill: 0 - predniSONE  (DELTASONE ) 20 MG tablet; Take 2 tablets (40 mg total) by mouth daily with breakfast for 5 days.  Dispense: 10 tablet; Refill: 0  2. Type 2 diabetes mellitus with diabetic mononeuropathy, with long-term current use of insulin  (HCC)  Rest Ice ROM exercises discussed, handout given  Start diclofenac  BID with food, No other NSAID's. Start prednisone , strict low carb diet Baclofen , sedation precautions discussed Follow up with PCP if symptoms worsen or do not improve   Follow Up Instructions: I discussed the assessment and treatment plan with the patient. The patient was provided an opportunity to ask questions and all were answered. The patient agreed with the plan and demonstrated an understanding of the instructions.  A copy of instructions were sent to the patient via MyChart unless otherwise noted below.     The patient was advised to call  back or seek an in-person evaluation if the symptoms worsen or if the condition fails to improve as anticipated.    Bari Learn, FNP

## 2024-01-15 ENCOUNTER — Other Ambulatory Visit (HOSPITAL_COMMUNITY): Payer: Self-pay

## 2024-01-15 ENCOUNTER — Telehealth: Payer: Self-pay

## 2024-01-15 NOTE — Telephone Encounter (Signed)
 Attempted to contact the patient but was unable to reach her. A detailed voicemail was left requesting a return call to schedule an appointment. The patient completed a virtual visit with [facility] on 01/14/2024.

## 2024-01-15 NOTE — Telephone Encounter (Signed)
 Pharmacy Patient Advocate Encounter   Received notification from Onbase that prior authorization for Diclofenac  Sodium 75MG  dr tablets  is required/requested.   Insurance verification completed.   The patient is insured through [employer] .   Per test claim: PA required; PA submitted to above mentioned insurance via Latent Key/confirmation #/EOC [id number] Status is pending

## 2024-01-16 MED ORDER — NAPROXEN 500 MG PO TABS: 500.0000 mg | ORAL_TABLET | Freq: Two times a day (BID) | ORAL | 1 refills | Status: DC

## 2024-01-16 NOTE — Addendum Note (Signed)
 Addended by: LAVELL LYE A on: 01/16/2024 10:14 AM   Modules accepted: Orders

## 2024-01-16 NOTE — Telephone Encounter (Signed)
 RX prescribed by Bari Learn, FNP

## 2024-01-16 NOTE — Telephone Encounter (Signed)
 Pharmacy Patient Advocate Encounter  Received notification from [employer] that Prior Authorization for Diclofenac  Sodium 75MG  dr tablets  has been DENIED.  See denial reason below. No denial letter attached in CMM. Will attach denial letter to Media tab once received.   PA #/Case ID/Reference #: [id number]

## 2024-01-16 NOTE — Telephone Encounter (Signed)
 Diclofenac  changed to Naprosyn  per insurance.

## 2024-01-21 ENCOUNTER — Encounter (HOSPITAL_COMMUNITY): Payer: Self-pay

## 2024-01-21 ENCOUNTER — Emergency Department (HOSPITAL_COMMUNITY)

## 2024-01-21 ENCOUNTER — Other Ambulatory Visit: Payer: Self-pay

## 2024-01-21 ENCOUNTER — Emergency Department (HOSPITAL_COMMUNITY)
Admission: EM | Admit: 2024-01-21 | Discharge: 2024-01-22 | Disposition: A | Discharge status: Home / Self Care | Attending: Emergency Medicine | Admitting: Emergency Medicine

## 2024-01-21 DIAGNOSIS — Z794 Long term (current) use of insulin: Secondary | ICD-10-CM | POA: Diagnosis not present

## 2024-01-21 DIAGNOSIS — M79604 Pain in right leg: Secondary | ICD-10-CM | POA: Diagnosis present

## 2024-01-21 DIAGNOSIS — M5431 Sciatica, right side: Secondary | ICD-10-CM | POA: Diagnosis not present

## 2024-01-21 LAB — URINALYSIS, ROUTINE W REFLEX MICROSCOPIC
Bilirubin Urine: NEGATIVE
Glucose, UA: NEGATIVE mg/dL
Hgb urine dipstick: NEGATIVE
Ketones, ur: NEGATIVE mg/dL
Leukocytes,Ua: NEGATIVE
Nitrite: NEGATIVE
Protein, ur: NEGATIVE mg/dL
Specific Gravity, Urine: 1.024 (ref 1.005–1.030)
pH: 5 (ref 5.0–8.0)

## 2024-01-21 MED ORDER — HYDROCODONE-ACETAMINOPHEN 5-325 MG PO TABS: 1.0000 | ORAL_TABLET | ORAL | 0 refills | Status: DC | PRN

## 2024-01-21 MED ORDER — KETOROLAC TROMETHAMINE 60 MG/2ML IM SOLN
60.0000 mg | Freq: Once | INTRAMUSCULAR | Status: AC
Start: 2024-01-21 — End: 2024-01-21
  Administered 2024-01-21: 60 mg via INTRAMUSCULAR
  Filled 2024-01-21: qty 2

## 2024-01-21 MED ORDER — ONDANSETRON 4 MG PO TBDP
8.0000 mg | ORAL_TABLET | Freq: Once | ORAL | Status: AC
  Administered 2024-01-21: 8 mg via ORAL
  Filled 2024-01-21: qty 2

## 2024-01-21 MED ORDER — HYDROMORPHONE HCL 1 MG/ML IJ SOLN
1.0000 mg | Freq: Once | INTRAMUSCULAR | Status: AC
  Administered 2024-01-21: 1 mg via INTRAMUSCULAR
  Filled 2024-01-21: qty 1

## 2024-01-21 NOTE — ED Triage Notes (Signed)
 Pt c/o L back and L leg pain x 1 week; evaluated for same last week and was given steroids but states they are not working; denies known injury

## 2024-01-21 NOTE — ED Provider Notes (Signed)
 [place] EMERGENCY DEPARTMENT AT [facility] Provider Note   CSN: [id number] Arrival date & time: 01/21/24  1545     Patient presents with: Hip Pain (/), Back Pain, and Leg Pain   [name] is a 53 y.o. female.   Presents to the emergency department for evaluation of right sided back, hip, leg pain.  Symptoms began about a week ago.  She denies trauma.  Pain is in the right lower back behind the hip and radiates down the thigh all the way to the calf area.  Pain worsens with any movement.  Her doctor prescribed a muscle relaxer, Naprosyn  and steroid.  She is not improving.       Prior to Admission medications   Medication Sig Start Date End Date Taking? Authorizing Provider  HYDROcodone -acetaminophen  (NORCO/VICODIN) 5-325 MG tablet Take 1-2 tablets by mouth every 4 (four) hours as needed for moderate pain (pain score 4-6). 01/21/24  Yes Star Resler, Lonni PARAS, MD  baclofen  (LIORESAL ) 10 MG tablet Take 1 tablet (10 mg total) by mouth 3 (three) times daily as needed for muscle spasms. 01/14/24   Lavell Lye A, FNP  Blood Glucose Monitoring Suppl (ACCU-CHEK GUIDE ME) w/Device KIT 3 (three) times daily. Use to test blood sugars three times daily 08/04/23   [provider]  Blood Glucose Monitoring Suppl DEVI 1 each by Does not apply route 3 (three) times daily. May dispense any manufacturer covered by patient's insurance. 08/04/23   [name], DO  Continuous Glucose Receiver (DEXCOM G7 RECEIVER) DEVI Use with Dexcom G7 Sensors 10/28/23   [name], [name], NP  Continuous Glucose Sensor (DEXCOM G7 SENSOR) MISC Apply 1 sensor every 10 days 10/28/23   [name], NP  Glucagon  (BAQSIMI  ONE PACK) 3 MG/DOSE POWD Place 1 Device into the nose as needed (Low blood sugar with impaired consciousness). 12/11/23   Motwani, Komal, MD  glucose blood (ACCU-CHEK GUIDE TEST) test strip Use to check blood sugar three times day 11/19/23   [name], [name], NP  insulin   glargine (LANTUS ) 100 UNIT/ML injection Inject 10 Units into the skin at bedtime. Patient taking differently: Inject 17 Units into the skin at bedtime.    [provider]  insulin  lispro (HUMALOG  KWIKPEN) 100 UNIT/ML KwikPen Inject 4 Units into the skin with breakfast, with lunch, and with evening meal. 08/05/23   [name], DO  Insulin  Pen Needle (PEN NEEDLES) 31G X 5 MM MISC 1 each by Does not apply route 3 (three) times daily. May dispense any manufacturer covered by patient's insurance. 08/04/23   [name], DO  Lancet Device MISC 1 each by Does not apply route 3 (three) times daily. May dispense any manufacturer covered by patient's insurance. 08/04/23   [name], DO  Lancets MISC 1 each by Does not apply route 3 (three) times daily. Use as directed to check blood sugar. May dispense any manufacturer covered by patient's insurance and fits patient's device. 08/04/23   [name], DO  naproxen  (NAPROSYN ) 500 MG tablet Take 1 tablet (500 mg total) by mouth 2 (two) times daily with a meal. 01/16/24   Lavell Lye LABOR, FNP    Allergies: Patient has no known allergies.    Review of Systems  Updated Vital Signs BP (!) 163/81 (BP Location: Left Arm)   Pulse 64   Temp (!) 97.5 F (36.4 C)   Resp 16   Ht 5' 7 (1.702 m)   Wt 64.9 kg   SpO2 98%  BMI 22.40 kg/m   Physical Exam Vitals and nursing note reviewed.  Constitutional:      Appearance: Normal appearance.  Cardiovascular:     Rate and Rhythm: Normal rate.  Pulmonary:     Effort: Pulmonary effort is normal.  Musculoskeletal:     Cervical back: Normal.     Thoracic back: Normal.     Lumbar back: Spasms and tenderness present. Positive right straight leg raise test.     Comments: No saddle anesthesia, no foot drop   Skin:    General: Skin is warm and dry.     Findings: No rash.  Neurological:     General: No focal deficit present.     Mental Status: She is alert and oriented to person, place, and  time.     (all labs ordered are listed, but only abnormal results are displayed) Labs Reviewed  URINALYSIS, ROUTINE W REFLEX MICROSCOPIC    EKG: None  Radiology: DG Lumbar Spine Complete Result Date: 01/21/2024 CLINICAL DATA:  Lower back and right leg pain for 1 week. EXAM: LUMBAR SPINE - COMPLETE 4+ VIEW COMPARISON:  None Available. FINDINGS: There is no evidence of lumbar spine fracture. Alignment is normal. Mild degenerative disc disease is noted at L5-S1. IMPRESSION: Mild degenerative disc disease at L5-S1. No acute abnormality seen. Electronically Signed   By: Lynwood Landy Raddle M.D.   On: 01/21/2024 17:08     Procedures   Medications Ordered in the ED  ketorolac (TORADOL) injection 60 mg (has no administration in time range)  HYDROmorphone (DILAUDID) injection 1 mg (has no administration in time range)  ondansetron (ZOFRAN-ODT) disintegrating tablet 8 mg (has no administration in time range)                                    Medical Decision Making Risk Prescription drug management.   Differential Diagnosis considered includes, but not limited to: Muscle strain; acute disc herniation; lumbar fracture; cauda equina syndrome; infection; malignancy  Presents with progressively worsening right sided low back and right leg pain.  Symptoms progressed over a week, nontraumatic.  Not responding to steroids, Naprosyn , muscle relaxers provided by PCP.  Examination reveals painful inhibition of movements of the right leg but strength is preserved.  Normal sensation.  No red flags.  Patient spends 7 hours in the waiting room before being seen.  I did offer MRI, although did not feel that emergent MRI was absolutely needed.  Patient would prefer to schedule as an outpatient through PCP.  Will provide increased analgesia today, follow-up with PCP to schedule outpatient imaging, possible referral to spinal surgery versus physical therapy based on results.     Final diagnoses:   Sciatica of right side    ED Discharge Orders          Ordered    HYDROcodone -acetaminophen  (NORCO/VICODIN) 5-325 MG tablet  Every 4 hours PRN        01/21/24 2340               Haze Lonni PARAS, MD 01/21/24 2340

## 2024-01-21 NOTE — ED Provider Triage Note (Signed)
 Emergency Medicine Provider Triage Evaluation Note  [name] , a 53 y.o. female  was evaluated in triage.  Pt complains of radicular pain. Pain to lower back radiates to R leg on going x 1 week.  Had a virtual visit recently, diagnosed with sciatica and was prescribed meds without relief.  Endorse some constipation, and urinary sxs as well.  No fever, chills, no numbness/weakness.  No trauma.    Review of Systems  Positive: As above Negative: As above  Physical Exam  BP (!) 171/114 (BP Location: Right Arm)   Pulse 82   Temp 98.7 F (37.1 C)   Resp 20   Ht 5' 7 (1.702 m)   Wt 64.9 kg   SpO2 99%   BMI 22.40 kg/m  Gen:   Awake, no distress   Resp:  Normal effort  MSK:   Moves extremities without difficulty  Other:    Medical Decision Making  Medically screening exam initiated at 4:15 PM.  Appropriate orders placed.  Arvetta Tatum-Bowden was informed that the remainder of the evaluation will be completed by another provider, this initial triage assessment does not replace that evaluation, and the importance of remaining in the ED until their evaluation is complete.    Nivia Colon, PA-C 01/21/24 [phone]

## 2024-01-21 NOTE — ED Triage Notes (Signed)
 Patient states she has right sided leg pain, hip and back pain. Was prescribed medications for possible sciatic and it is not working. She has been taking steroids , muscle relaxers and pain medication. She states she wants a MRI to find out what's going on. She also states the steroids are increasing her blood sugar into the 300-400 range all week.

## 2024-01-21 NOTE — ED Notes (Signed)
 Patient at the nursing station yelling at quick triage nurse about wait time. RN explained to patient that wait time is not based on the amount of time that they have been here it is based on who is the most critical. Patient still irate and yelling at nurse.

## 2024-01-21 NOTE — ED Notes (Signed)
 Pt has urine cup from triage.  Advised pt that urine sample is needed.  Pt advises that she can not void.  She states that she is going to eat first.  She has [employer] chicken meal.

## 2024-01-22 ENCOUNTER — Telehealth: Payer: Self-pay | Admitting: Pharmacist

## 2024-01-22 ENCOUNTER — Ambulatory Visit: Admitting: "Endocrinology

## 2024-01-22 ENCOUNTER — Ambulatory Visit: Admitting: Sports Medicine

## 2024-01-22 DIAGNOSIS — Z79899 Other long term (current) drug therapy: Secondary | ICD-10-CM

## 2024-01-22 NOTE — Progress Notes (Unsigned)
   01/22/2024  Patient ID: [name], female   DOB: October 18, 1970, 53 y.o.   MRN: [id number]  Patient called stating that she went to the emergency room over the weekend for right-sided sciatic pain. She was prescribed a pain medication; however, when her mother attempted to pick up the prescription, the pharmacy was unable to dispense it due to insurance coverage issues  HIPAA identifiers were obtained.    During a conference call with the patient and the pharmacy [facility]), it was determined that the prescription could not be filled because of [facility]'s policy regarding first-time narcotic prescriptions. [facility] policy limits the initial acute opioid prescription to 7 days with a maximum daily dose capped at 15 morphine milligram equivalents (MME).  [place]  [place] state law limits the first prescription for acute pain to a 5-day supply, but does not have a specific MME cap.  The [facility] pharmacist suggested the prescription be rewritten to comply with policy. Specifically, instead of stating "Take 1-2 tablets by mouth every 4 hours as needed for moderate pain," the pharmacist recommended it read "Take 1 tablet by mouth every 4 hours as needed for pain."  Because the prescription originated from the emergency room provider, the patient was advised to contact the ER for a revised prescription. The pharmacy was also asked to fax their request directly to the provider.   Plan: Follow up in 5-7 business days.   [name], PharmD, BCACP Clinical Pharmacist [phone]

## 2024-02-11 ENCOUNTER — Telehealth

## 2024-02-29 ENCOUNTER — Other Ambulatory Visit: Payer: Self-pay | Admitting: Nurse Practitioner

## 2024-02-29 DIAGNOSIS — Z794 Long term (current) use of insulin: Secondary | ICD-10-CM

## 2024-03-02 ENCOUNTER — Telehealth: Payer: Self-pay

## 2024-03-02 NOTE — Telephone Encounter (Signed)
 Incoming fax received from patients pharmacy to initiate a prior authorization for                   .  PA initiated through covermymeds. Key: [id number]   Awaiting reply from the insurance company which can take up to 5 business days

## 2024-03-02 NOTE — Telephone Encounter (Signed)
   I called patients pharmacy and left a detailed voicemail notifying them of prior authorization approval.

## 2024-03-03 ENCOUNTER — Other Ambulatory Visit: Payer: Self-pay | Admitting: Family

## 2024-03-03 DIAGNOSIS — M5431 Sciatica, right side: Secondary | ICD-10-CM

## 2024-03-16 ENCOUNTER — Telehealth: Payer: Self-pay | Admitting: Pharmacist

## 2024-03-16 DIAGNOSIS — Z794 Long term (current) use of insulin: Secondary | ICD-10-CM

## 2024-03-16 NOTE — Progress Notes (Signed)
   03/16/2024  Patient ID: [name], female   DOB: August 12, 1970, 53 y.o.   MRN: [id number]  Patient was called regarding diabetes management. Unfortunately, she did not answer her phone. HIPAA compliant message was left on her voicemail.  Plan: Call Patient back in 1 month.   [name], PharmD, BCACP Clinical Pharmacist [phone]

## 2024-04-06 ENCOUNTER — Encounter: Payer: Self-pay | Admitting: Nurse Practitioner

## 2024-04-06 ENCOUNTER — Ambulatory Visit: Admitting: Nurse Practitioner

## 2024-04-06 VITALS — BP 128/76 | HR 78 | Temp 96.9°F | Resp 18 | Ht 66.0 in | Wt 156.4 lb

## 2024-04-06 DIAGNOSIS — Z794 Long term (current) use of insulin: Secondary | ICD-10-CM | POA: Diagnosis not present

## 2024-04-06 DIAGNOSIS — Z1159 Encounter for screening for other viral diseases: Secondary | ICD-10-CM

## 2024-04-06 DIAGNOSIS — R0981 Nasal congestion: Secondary | ICD-10-CM | POA: Diagnosis not present

## 2024-04-06 DIAGNOSIS — E1141 Type 2 diabetes mellitus with diabetic mononeuropathy: Secondary | ICD-10-CM | POA: Diagnosis not present

## 2024-04-06 DIAGNOSIS — N939 Abnormal uterine and vaginal bleeding, unspecified: Secondary | ICD-10-CM | POA: Diagnosis not present

## 2024-04-06 NOTE — Progress Notes (Signed)
 Careteam: Patient Care Team: [name], NP as PCP - General (Geriatric Medicine) [name], [facility] as Pharmacist (Pharmacist)  PLACE OF SERVICE:  [facility]  Advanced Directive information    Allergies[1]  Chief Complaint  Patient presents with   Medical Management of Chronic Issues    Kidney check and subscriptions     HPI:  Discussed the use of AI scribe software for clinical note transcription with the patient, who gave verbal consent to proceed.  History of Present Illness [name] is a 53 year old female who presents for a routine follow-up.  She is concerned about her diabetes management, particularly why her blood sugar numbers are not improving. She is currently taking Lantus  and Lispro for her diabetes, administering 17 units of Lantus  at night, although she sometimes skips it if her blood sugar is high before bed. Her fasting blood sugars range from 121 to 179 mg/dL. She has experienced a low blood sugar episode once, with a reading of 44 mg/dL, which made her feel sweaty and faint.  She previously took hydrocodone  for hip pain following a fall, which was severe enough to require hospital treatment. She is no longer taking hydrocodone , Baclofen , or naproxen .  She reports chronic nasal congestion and was told by an ENT that she has a nasal blockage requiring surgery. She has not seen an allergist and experiences constant congestion, which is exacerbated by working outside.  She has not had a mammogram due to work conflicts and has not scheduled one currently. She completed a Cologuard test with results received. She has not had a recent Pap smear and experiences abnormal uterine bleeding, which has been attributed to fibroids by previous providers.  She reports loose stools since starting insulin , with multiple bowel movements per day, but denies diarrhea. She drinks only water and does not consume sugary foods.   Review of Systems:  Review  of Systems  Constitutional:  Negative for chills, fever and weight loss.  HENT:  Negative for tinnitus.   Respiratory:  Negative for cough, sputum production and shortness of breath.   Cardiovascular:  Negative for chest pain, palpitations and leg swelling.  Gastrointestinal:  Negative for abdominal pain, constipation, diarrhea and heartburn.  Genitourinary:  Negative for dysuria, frequency and urgency.  Musculoskeletal:  Negative for back pain, falls, joint pain and myalgias.  Skin: Negative.   Neurological:  Negative for dizziness and headaches.  Psychiatric/Behavioral:  Negative for depression and memory loss. The patient does not have insomnia.     Past Medical History:  Diagnosis Date   Allergy    Tobacco abuse    Uterine fibroid    History reviewed. No pertinent surgical history. Social History:   reports that she has been smoking cigarettes. She started smoking about 20 years ago. She has a 2.7 pack-year smoking history. She has never used smokeless tobacco. She reports current alcohol use of about 4.0 standard drinks of alcohol per week. She reports that she does not use drugs.  Family History  Problem Relation Age of Onset   Cancer Mother 36       nonhodgkin's lymphoma   Kidney disease Father    Hyperlipidemia Father    Heart disease Father    Diabetes Father    Cancer Father        throat and unknown cancer   Diabetes Brother    Kidney disease Brother     Medications: Patient's Medications  New Prescriptions   No medications on  file  Previous Medications   BLOOD GLUCOSE MONITORING SUPPL (ACCU-CHEK GUIDE ME) W/DEVICE KIT    3 (three) times daily. Use to test blood sugars three times daily   BLOOD GLUCOSE MONITORING SUPPL DEVI    1 each by Does not apply route 3 (three) times daily. May dispense any manufacturer covered by patient's insurance.   CONTINUOUS GLUCOSE RECEIVER (DEXCOM G7 RECEIVER) DEVI    Use with Dexcom G7 Sensors   CONTINUOUS GLUCOSE SENSOR (DEXCOM  G7 SENSOR) MISC    APPLY 1 SENSOR EVERY 10 DAYS   GLUCAGON  (BAQSIMI  ONE PACK) 3 MG/DOSE POWD    Place 1 Device into the nose as needed (Low blood sugar with impaired consciousness).   GLUCOSE BLOOD (ACCU-CHEK GUIDE TEST) TEST STRIP    Use to check blood sugar three times day   INSULIN  GLARGINE (LANTUS ) 100 UNIT/ML INJECTION    Inject 17 Units into the skin at bedtime.   INSULIN  LISPRO (HUMALOG  KWIKPEN) 100 UNIT/ML KWIKPEN    Inject 4 Units into the skin with breakfast, with lunch, and with evening meal.   INSULIN  PEN NEEDLE (PEN NEEDLES) 31G X 5 MM MISC    1 each by Does not apply route 3 (three) times daily. May dispense any manufacturer covered by patient's insurance.   LANCET DEVICE MISC    1 each by Does not apply route 3 (three) times daily. May dispense any manufacturer covered by patient's insurance.   LANCETS MISC    1 each by Does not apply route 3 (three) times daily. Use as directed to check blood sugar. May dispense any manufacturer covered by patient's insurance and fits patient's device.  Modified Medications   No medications on file  Discontinued Medications   BACLOFEN  (LIORESAL ) 10 MG TABLET    Take 1 tablet (10 mg total) by mouth 3 (three) times daily as needed for muscle spasms.   HYDROCODONE -ACETAMINOPHEN  (NORCO/VICODIN) 5-325 MG TABLET    Take 1-2 tablets by mouth every 4 (four) hours as needed for moderate pain (pain score 4-6).   NAPROXEN  (NAPROSYN ) 500 MG TABLET    Take 1 tablet (500 mg total) by mouth 2 (two) times daily with a meal.    Physical Exam:  Vitals:   04/06/24 1444  BP: 128/76  Pulse: 78  Resp: 18  Temp: (!) 96.9 F (36.1 C)  SpO2: 97%  Weight: 156 lb 6.4 oz (70.9 kg)  Height: 5' 6 (1.676 m)   Body mass index is 25.24 kg/m. Wt Readings from Last 3 Encounters:  04/06/24 156 lb 6.4 oz (70.9 kg)  01/21/24 143 lb (64.9 kg)  12/11/23 148 lb (67.1 kg)    Physical Exam Constitutional:      General: She is not in acute distress.    Appearance: She  is well-developed. She is not diaphoretic.  HENT:     Head: Normocephalic and atraumatic.     Mouth/Throat:     Pharynx: No oropharyngeal exudate.  Eyes:     Conjunctiva/sclera: Conjunctivae normal.     Pupils: Pupils are equal, round, and reactive to light.  Cardiovascular:     Rate and Rhythm: Normal rate and regular rhythm.     Heart sounds: Normal heart sounds.  Pulmonary:     Effort: Pulmonary effort is normal.     Breath sounds: Normal breath sounds.  Abdominal:     General: Bowel sounds are normal.     Palpations: Abdomen is soft.  Musculoskeletal:     Cervical back: Normal range  of motion and neck supple.     Right lower leg: No edema.     Left lower leg: No edema.  Skin:    General: Skin is warm and dry.  Neurological:     Mental Status: She is alert.  Psychiatric:        Mood and Affect: Mood normal.     Labs reviewed: Basic Metabolic Panel: Recent Labs    08/03/23 1409 08/03/23 2033 08/04/23 0101  NA 132* 133* 132*  K 4.4 3.2* 3.8  CL 92* 101 98  CO2 22 23 23   GLUCOSE 417* 310* 298*  BUN 14 10 9   CREATININE 0.90 0.70 0.63  CALCIUM 10.2 9.3 8.9   Liver Function Tests: Recent Labs    08/03/23 1114  AST 25  ALT 30  ALKPHOS 84  BILITOT 1.4*  PROT 8.3*  ALBUMIN 4.7   No results for input(s): LIPASE, AMYLASE in the last 8760 hours. No results for input(s): AMMONIA in the last 8760 hours. CBC: Recent Labs    08/03/23 1114 08/03/23 1209 08/04/23 0101  WBC 3.9*  --  4.6  NEUTROABS  --   --  2.0  HGB 16.1* 15.3* 12.5  HCT 49.5* 45.0 37.3  MCV 95.9  --  93.7  PLT 327  --  251   Lipid Panel: No results for input(s): CHOL, HDL, LDLCALC, TRIG, CHOLHDL, LDLDIRECT in the last 8760 hours. TSH: No results for input(s): TSH in the last 8760 hours. A1C: Lab Results  Component Value Date   HGBA1C 6.3 (A) 12/11/2023     Assessment/Plan Assessment and Plan Assessment & Plan Type 2 diabetes mellitus with diabetic  mononeuropathy Suboptimal glycemic control due to non-adherence to Lantus  regimen. one hypoglycemic episode noted. -Encouraged dietary compliance, routine foot care/monitoring and to keep up with diabetic eye exams through ophthalmology  - Educated on taking Lantus  17 units nightly. - Continue Lispro (Humalog ) at meals as needed for elevated blood sugars  - Checked A1c level.  Chronic nasal congestion Persistent nasal congestion with possible blockage requiring surgery. No prior allergist consultation. - Referred to allergist for further evaluation.  Abnormal uterine bleeding Chronic abnormal uterine bleeding with previous evaluations suggesting fibroids. No recent GYN follow-up since 2017. - referral has been placed, advised scheduling gynecologist appointment.  Loose stools Encouraged to increase dietary fiber Can also add benefiber daily  General health maintenance Pending mammogram and Pap smear. Hepatitis C screening not previously done. - Ordered hepatitis C screening. - Advised scheduling mammogram and Pap smear.   Return in about 6 months (around 10/05/2024).  Trexton Escamilla K. Caro BODILY [facility] [phone]      [1] No Known Allergies

## 2024-04-06 NOTE — Patient Instructions (Addendum)
 Call and make appt to see Dr. Winton Felt [facility] for [facility] at [facility] [address] [place] [address]  [phone]   To make appt for mammogram    Benefiber daily to help bulk stools

## 2024-04-07 ENCOUNTER — Ambulatory Visit: Payer: Self-pay | Admitting: Nurse Practitioner

## 2024-04-07 DIAGNOSIS — E1141 Type 2 diabetes mellitus with diabetic mononeuropathy: Secondary | ICD-10-CM

## 2024-04-07 DIAGNOSIS — E781 Pure hyperglyceridemia: Secondary | ICD-10-CM

## 2024-04-07 LAB — CBC WITH DIFFERENTIAL/PLATELET
Absolute Lymphocytes: 2142 {cells}/uL (ref 850–3900)
Absolute Monocytes: 403 {cells}/uL (ref 200–950)
Basophils Absolute: 41 {cells}/uL (ref 0–200)
Basophils Relative: 0.8 %
Eosinophils Absolute: 393 {cells}/uL (ref 15–500)
Eosinophils Relative: 7.7 %
HCT: 41.9 % (ref 35.9–46.0)
Hemoglobin: 14.1 g/dL (ref 11.7–15.5)
MCH: 31.8 pg (ref 27.0–33.0)
MCHC: 33.7 g/dL (ref 31.6–35.4)
MCV: 94.4 fL (ref 81.4–101.7)
MPV: 10.5 fL (ref 7.5–12.5)
Monocytes Relative: 7.9 %
Neutro Abs: 2122 {cells}/uL (ref 1500–7800)
Neutrophils Relative %: 41.6 %
Platelets: 268 Thousand/uL (ref 140–400)
RBC: 4.44 Million/uL (ref 3.80–5.10)
RDW: 12.4 % (ref 11.0–15.0)
Total Lymphocyte: 42 %
WBC: 5.1 Thousand/uL (ref 3.8–10.8)

## 2024-04-07 LAB — COMPREHENSIVE METABOLIC PANEL WITH GFR
AG Ratio: 1.8 (calc) (ref 1.0–2.5)
ALT: 23 U/L (ref 6–29)
AST: 19 U/L (ref 10–35)
Albumin: 4.4 g/dL (ref 3.6–5.1)
Alkaline phosphatase (APISO): 90 U/L (ref 37–153)
BUN: 14 mg/dL (ref 7–25)
CO2: 22 mmol/L (ref 20–32)
Calcium: 9.4 mg/dL (ref 8.6–10.4)
Chloride: 104 mmol/L (ref 98–110)
Creat: 0.77 mg/dL (ref 0.50–1.03)
Globulin: 2.5 g/dL (ref 1.9–3.7)
Glucose, Bld: 157 mg/dL — ABNORMAL HIGH (ref 65–139)
Potassium: 3.9 mmol/L (ref 3.5–5.3)
Sodium: 139 mmol/L (ref 135–146)
Total Bilirubin: 0.4 mg/dL (ref 0.2–1.2)
Total Protein: 6.9 g/dL (ref 6.1–8.1)
eGFR: 91 mL/min/1.73m2 (ref >=60)

## 2024-04-07 LAB — HEPATITIS C ANTIBODY: Hepatitis C Ab: NONREACTIVE

## 2024-04-07 LAB — HEMOGLOBIN A1C
Hgb A1c MFr Bld: 7.3 % — ABNORMAL HIGH (ref <5.7)
Mean Plasma Glucose: 163 mg/dL
eAG (mmol/L): 9 mmol/L

## 2024-04-07 LAB — LIPID PANEL
Cholesterol: 245 mg/dL — ABNORMAL HIGH (ref <200)
HDL: 57 mg/dL (ref >=50)
Non-HDL Cholesterol (Calc): 188 mg/dL — ABNORMAL HIGH (ref <130)
Total CHOL/HDL Ratio: 4.3 (calc) (ref <5.0)
Triglycerides: 1033 mg/dL — ABNORMAL HIGH (ref <150)

## 2024-04-07 MED ORDER — FENOFIBRATE 54 MG PO TABS: 54.0000 mg | ORAL_TABLET | Freq: Every day | ORAL | 1 refills | Status: DC

## 2024-04-07 MED ORDER — LANTUS SOLOSTAR 100 UNIT/ML ~~LOC~~ SOPN: 17.0000 [IU] | PEN_INJECTOR | Freq: Every day | SUBCUTANEOUS | 5 refills | Status: AC

## 2024-04-07 MED ORDER — DEXCOM G7 SENSOR MISC: 11 refills | Status: AC

## 2024-04-07 NOTE — Addendum Note (Signed)
 Addended by: VICCI ROSELYN HERO on: 04/07/2024 02:55 PM   Modules accepted: Orders

## 2024-04-09 ENCOUNTER — Telehealth: Payer: Self-pay

## 2024-04-09 NOTE — Telephone Encounter (Signed)
 Copied from CRM #[id number]. Topic: Clinical - Prescription Issue >> Apr 09, 2024 11:04 AM Diannia H wrote: Reason for CRM: Patient is needing to speak to the provider about some medicine she sent to the pharmacy. She states the pharmacy stated that the provider is needing to speak to the insurance company. I asked was is for a prior auth and she said she was not sure. The pharmacy stated they can't fill the medicine until they speak with the provider. Could you assist? Call back number is [phone]. She said its for the cholesterol medicine.

## 2024-04-09 NOTE — Telephone Encounter (Signed)
 Incoming fax received from patients pharmacy to initiate a prior authorization for  Tricor  (generic)                  .  PA initiated through covermymeds. Key: [id number]  Awaiting reply from the insurance company which will be determined in 48-72 hours.

## 2024-04-09 NOTE — Telephone Encounter (Signed)
 Spoke with patient about PA and she is aware that we are waiting to hear back from the insurance company.

## 2024-04-14 ENCOUNTER — Encounter: Payer: Self-pay | Admitting: Nurse Practitioner

## 2024-04-29 ENCOUNTER — Telehealth: Payer: Self-pay | Admitting: Pharmacist

## 2024-04-29 DIAGNOSIS — Z794 Long term (current) use of insulin: Secondary | ICD-10-CM

## 2024-04-29 DIAGNOSIS — E781 Pure hyperglyceridemia: Secondary | ICD-10-CM

## 2024-04-30 MED ORDER — FENOFIBRATE 145 MG PO TABS: 145.0000 mg | ORAL_TABLET | Freq: Every day | ORAL | 1 refills | Status: AC

## 2024-04-30 NOTE — Progress Notes (Addendum)
 "  04/30/2024 Name: [name] MRN: [id number] DOB: [birth year]  Chief Complaint  Patient presents with   Medication Management    Diabetes    [name] is a 54 y.o. year old female who presented for a telephone visit.   They were referred to the pharmacist by their PCP for assistance in managing diabetes.  Purpose of today's call is to follow up.   Subjective:  Care Team: Primary Care Provider: [name], NP ; Next Scheduled Visit: 10/05/2024   Medication Access/Adherence  Current Pharmacy:  [id number] - [address] ([place]), [place] - [address] [place] ([place]) [place] [address] Phone: [phone] Fax: [phone]  [place] [facility] [address], [address] [place] [address] Phone: [phone] Fax: [phone]   Patient reports affordability concerns with their medications: Yes  Patient reports access/transportation concerns to their pharmacy: No  Patient reports adherence concerns with their medications:  Yes  Cost-Fenofibrate --not covered by insurance   Diabetes: Lantus  17 units daily Humalog  4 units with breakfast, lunch, and dinner   Patient uses Dexcom G7 sensors. I do not have access to the clinic's Dexcom Clarity Page.     Patient denies hypoglycemic s/sx including  dizziness, shakiness, sweating. Patient denies hyperglycemic symptoms including  polyuria, polydipsia, polyphagia, nocturia, neuropathy, blurred vision.       04/06/2024    2:44 PM 01/21/2024    7:48 PM 01/21/2024    4:00 PM  Vitals with BMI  Height 5' 6  5' 7  Weight 156 lbs 6 oz  143 lbs  BMI 25.26  22.39  Systolic 128 163   Diastolic 76 81   Pulse 78 64      Macrovascular and Microvascular Risk Reduction:  Statin? no; ACEi/ARB? no Last urinary albumin/creatinine ratio:  Lab Results  Component Value Date   MICRALBCREAT 9 08/04/2023   Last eye exam:   Last foot exam:  09/02/2023 Tobacco Use:  Tobacco Use: High Risk (04/06/2024)   Patient History    Smoking Tobacco Use: Some Days    Smokeless Tobacco Use: Never    Passive Exposure: Not on file     Objective:  Lab Results  Component Value Date   HGBA1C 7.3 (H) 04/06/2024    Lab Results  Component Value Date   CREATININE 0.77 04/06/2024   BUN 14 04/06/2024   NA 139 04/06/2024   K 3.9 04/06/2024   CL 104 04/06/2024   CO2 22 04/06/2024    Lab Results  Component Value Date   CHOL 245 (H) 04/06/2024   HDL 57 04/06/2024   LDLCALC  04/06/2024     Comment:     . LDL cholesterol not calculated. Triglyceride levels greater than 400 mg/dL invalidate calculated LDL results. . Reference range: <100 . Desirable range <100 mg/dL for primary prevention;   <70 mg/dL for patients with CHD or diabetic patients  with > or = 2 CHD risk factors. [name] LDL-C is now calculated using the Martin-Hopkins  calculation, which is a validated novel method providing  better accuracy than the Friedewald equation in the  estimation of LDL-C.  [name] et al. [name]. 7986;689(80): 2061-2068  (http://education.[facility].com/faq/FAQ164)    TRIG 1,033 (H) 04/06/2024   CHOLHDL 4.3 04/06/2024    Medications Reviewed Today     Reviewed by [name], [facility] (Pharmacist) on 04/30/24 at 1530  Med List Status: <None>   Medication Order Taking? Sig Documenting Provider Last Dose Status Informant  Blood Glucose Monitoring Suppl (ACCU-CHEK GUIDE ME) w/Device KIT [id number] Yes 3 (three) times daily. Use to test blood sugars three times daily [provider]  Active   Blood Glucose Monitoring Suppl DEVI [id number] Yes 1 each by Does not apply route 3 (three) times daily. May dispense any manufacturer covered by patient's insurance. [name], DO  Active   Continuous Glucose Receiver [facility] G7 [place]) [place]IRIDION [id number] Yes Use with Dexcom G7 Sensors [name], NP  Active   Continuous Glucose  Sensor (DEXCOM G7 [place]) [place] [id number] Yes Apply 1 sensor every 10 days [name], [name], NP  Active   fenofibrate  54 MG tablet [id number]  Take 1 tablet (54 mg total) by mouth daily.  Patient not taking: Reported on 04/30/2024   [name], [name], NP  Active   Glucagon  (BAQSIMI  ONE PACK) 3 MG/DOSE POWD [id number] Yes Place 1 Device into the nose as needed (Low blood sugar with impaired consciousness). Motwani, Komal, MD  Active   glucose blood (ACCU-CHEK GUIDE TEST) test strip [id number] Yes Use to check blood sugar three times day [name], [name], NP  Active   insulin  glargine (LANTUS  SOLOSTAR) 100 UNIT/ML Solostar Pen [id number] Yes Inject 17 Units into the skin daily. [name], NP  Active   insulin  lispro (HUMALOG  KWIKPEN) 100 UNIT/ML KwikPen [id number] Yes Inject 4 Units into the skin with breakfast, with lunch, and with evening meal. [name], DO  Active   Insulin  Pen Needle (PEN NEEDLES) 31G X 5 MM MISC [id number] Yes 1 each by Does not apply route 3 (three) times daily. May dispense any manufacturer covered by patient's insurance. [name], DO  Active   Lancet Device MISC [id number] Yes 1 each by Does not apply route 3 (three) times daily. May dispense any manufacturer covered by patient's insurance. [name], DO  Active   Lancets MISC [id number] Yes 1 each by Does not apply route 3 (three) times daily. Use as directed to check blood sugar. May dispense any manufacturer covered by patient's insurance and fits patient's device. [name], DO  Active              Assessment/Plan:   Patient's Triglycerides were >1000 mg/dl in December. Her Provider started her on Fenofibrate  57 mg daily but the Patient did not take it because she said the Pharmacy told her it was not covered.  Patient is also not on statin therapy.  (A discussion was had with her PCP-statin therapy can be started after she tolerates Fenofibrate  therapy)   Patient's insurance formulary  was reviewed. Fenofibrate  48 mg and 145 mg are the only strengths of Fenofibrate  that are covered.  Patient's Provider Young Caro, NP) was notified and decided to switch the dose to Fenofibrate  145 mg daily.  New script will be sent to the Patient's pharmacy co-signature required.  Continue current diabetes regimen.    Follow Up Plan:  Follow up with Patient in 2 weeks.   [name], PharmD, BCACP Clinical Pharmacist [phone]    "

## 2024-05-08 ENCOUNTER — Other Ambulatory Visit

## 2024-05-08 DIAGNOSIS — E781 Pure hyperglyceridemia: Secondary | ICD-10-CM

## 2024-05-20 ENCOUNTER — Telehealth: Payer: Self-pay | Admitting: Pharmacist

## 2024-05-20 DIAGNOSIS — E1141 Type 2 diabetes mellitus with diabetic mononeuropathy: Secondary | ICD-10-CM

## 2024-05-20 NOTE — Progress Notes (Signed)
" ° °  05/20/2024 Name: [name] MRN: [id number] DOB: [birth year]  Chief Complaint  Patient presents with   Medication Management    Fenofibrate     Patient was called to follow up on fenofibrate .  Unfortunately, she did not answer her phone. HIPAA compliant message was left on her voicemail.  Verified patient picked up Fenofibrate  through Dr. Jacquelyn fill history database.  Plan: Follow up in 1 month.  [name], PharmD, BCACP Clinical Pharmacist [phone]  "

## 2024-10-05 ENCOUNTER — Ambulatory Visit: Admitting: Nurse Practitioner
